# Patient Record
Sex: Female | Born: 1945 | Race: White | Hispanic: No | Marital: Married | Smoking: Never smoker
Health system: Southern US, Community
[De-identification: ages and names within clinical notes are randomized; demographics above are authoritative.]

## PROBLEM LIST (undated history)

## (undated) DIAGNOSIS — Z789 Other specified health status: Secondary | ICD-10-CM

## (undated) HISTORY — PX: OTHER SURGICAL HISTORY: SHX169

## (undated) HISTORY — PX: JOINT REPLACEMENT: SHX530

---

## 1998-06-30 ENCOUNTER — Other Ambulatory Visit: Admission: RE | Admit: 1998-06-30 | Discharge: 1998-06-30 | Payer: Self-pay | Admitting: Obstetrics and Gynecology

## 1998-08-24 ENCOUNTER — Ambulatory Visit (HOSPITAL_COMMUNITY): Admission: RE | Admit: 1998-08-24 | Discharge: 1998-08-24 | Payer: Self-pay | Admitting: Gastroenterology

## 1999-07-12 ENCOUNTER — Other Ambulatory Visit: Admission: RE | Admit: 1999-07-12 | Discharge: 1999-07-12 | Payer: Self-pay | Admitting: Obstetrics and Gynecology

## 2000-06-06 ENCOUNTER — Other Ambulatory Visit: Admission: RE | Admit: 2000-06-06 | Discharge: 2000-06-06 | Payer: Self-pay | Admitting: Internal Medicine

## 2000-06-11 ENCOUNTER — Encounter: Payer: Self-pay | Admitting: Internal Medicine

## 2000-06-11 ENCOUNTER — Encounter: Admission: RE | Admit: 2000-06-11 | Discharge: 2000-06-11 | Payer: Self-pay | Admitting: Internal Medicine

## 2001-05-14 ENCOUNTER — Other Ambulatory Visit: Admission: RE | Admit: 2001-05-14 | Discharge: 2001-05-14 | Payer: Self-pay | Admitting: Obstetrics and Gynecology

## 2001-06-13 ENCOUNTER — Encounter: Admission: RE | Admit: 2001-06-13 | Discharge: 2001-06-13 | Payer: Self-pay | Admitting: Internal Medicine

## 2001-06-13 ENCOUNTER — Encounter: Payer: Self-pay | Admitting: Internal Medicine

## 2002-05-27 ENCOUNTER — Other Ambulatory Visit: Admission: RE | Admit: 2002-05-27 | Discharge: 2002-05-27 | Payer: Self-pay | Admitting: Obstetrics and Gynecology

## 2002-06-16 ENCOUNTER — Encounter: Payer: Self-pay | Admitting: Internal Medicine

## 2002-06-16 ENCOUNTER — Encounter: Admission: RE | Admit: 2002-06-16 | Discharge: 2002-06-16 | Payer: Self-pay | Admitting: Internal Medicine

## 2002-07-24 ENCOUNTER — Encounter: Admission: RE | Admit: 2002-07-24 | Discharge: 2002-07-24 | Payer: Self-pay | Admitting: Internal Medicine

## 2002-07-24 ENCOUNTER — Encounter: Payer: Self-pay | Admitting: Internal Medicine

## 2003-06-16 ENCOUNTER — Other Ambulatory Visit: Admission: RE | Admit: 2003-06-16 | Discharge: 2003-06-16 | Payer: Self-pay | Admitting: Obstetrics and Gynecology

## 2003-06-23 ENCOUNTER — Encounter: Admission: RE | Admit: 2003-06-23 | Discharge: 2003-06-23 | Payer: Self-pay | Admitting: Internal Medicine

## 2003-06-23 ENCOUNTER — Encounter: Payer: Self-pay | Admitting: Internal Medicine

## 2004-06-27 ENCOUNTER — Encounter: Admission: RE | Admit: 2004-06-27 | Discharge: 2004-06-27 | Payer: Self-pay | Admitting: Internal Medicine

## 2004-06-30 ENCOUNTER — Encounter: Admission: RE | Admit: 2004-06-30 | Discharge: 2004-06-30 | Payer: Self-pay | Admitting: Internal Medicine

## 2005-07-12 ENCOUNTER — Encounter: Admission: RE | Admit: 2005-07-12 | Discharge: 2005-07-12 | Payer: Self-pay | Admitting: Internal Medicine

## 2005-07-20 ENCOUNTER — Other Ambulatory Visit: Admission: RE | Admit: 2005-07-20 | Discharge: 2005-07-20 | Payer: Self-pay | Admitting: Internal Medicine

## 2005-07-24 ENCOUNTER — Encounter: Admission: RE | Admit: 2005-07-24 | Discharge: 2005-07-24 | Payer: Self-pay | Admitting: Internal Medicine

## 2005-07-31 ENCOUNTER — Encounter: Admission: RE | Admit: 2005-07-31 | Discharge: 2005-07-31 | Payer: Self-pay | Admitting: Internal Medicine

## 2006-07-13 ENCOUNTER — Encounter: Admission: RE | Admit: 2006-07-13 | Discharge: 2006-07-13 | Payer: Self-pay | Admitting: Internal Medicine

## 2006-07-23 ENCOUNTER — Other Ambulatory Visit: Admission: RE | Admit: 2006-07-23 | Discharge: 2006-07-23 | Payer: Self-pay | Admitting: Internal Medicine

## 2007-07-16 ENCOUNTER — Encounter: Admission: RE | Admit: 2007-07-16 | Discharge: 2007-07-16 | Payer: Self-pay | Admitting: Gastroenterology

## 2008-07-16 ENCOUNTER — Encounter: Admission: RE | Admit: 2008-07-16 | Discharge: 2008-07-16 | Payer: Self-pay | Admitting: Internal Medicine

## 2008-07-23 ENCOUNTER — Encounter: Admission: RE | Admit: 2008-07-23 | Discharge: 2008-07-23 | Payer: Self-pay | Admitting: Internal Medicine

## 2009-07-26 ENCOUNTER — Encounter: Admission: RE | Admit: 2009-07-26 | Discharge: 2009-07-26 | Payer: Self-pay | Admitting: Internal Medicine

## 2010-07-27 ENCOUNTER — Encounter: Admission: RE | Admit: 2010-07-27 | Discharge: 2010-07-27 | Payer: Self-pay | Admitting: Internal Medicine

## 2010-12-04 ENCOUNTER — Encounter: Payer: Self-pay | Admitting: Internal Medicine

## 2011-03-27 ENCOUNTER — Encounter (HOSPITAL_COMMUNITY)
Admission: RE | Admit: 2011-03-27 | Discharge: 2011-03-27 | Disposition: A | Discharge status: Home / Self Care | Payer: Medicare Other | Source: Ambulatory Visit | Attending: Orthopedic Surgery | Admitting: Orthopedic Surgery

## 2011-03-27 ENCOUNTER — Other Ambulatory Visit (HOSPITAL_COMMUNITY): Payer: Self-pay | Admitting: Orthopedic Surgery

## 2011-03-27 ENCOUNTER — Ambulatory Visit (HOSPITAL_COMMUNITY)
Admission: RE | Admit: 2011-03-27 | Discharge: 2011-03-27 | Disposition: A | Discharge status: Home / Self Care | Payer: Medicare Other | Source: Ambulatory Visit | Attending: Orthopedic Surgery | Admitting: Orthopedic Surgery

## 2011-03-27 DIAGNOSIS — Z01818 Encounter for other preprocedural examination: Secondary | ICD-10-CM | POA: Insufficient documentation

## 2011-03-27 DIAGNOSIS — M1711 Unilateral primary osteoarthritis, right knee: Secondary | ICD-10-CM

## 2011-03-27 DIAGNOSIS — I252 Old myocardial infarction: Secondary | ICD-10-CM | POA: Insufficient documentation

## 2011-03-27 DIAGNOSIS — Z01810 Encounter for preprocedural cardiovascular examination: Secondary | ICD-10-CM | POA: Insufficient documentation

## 2011-03-27 DIAGNOSIS — Z01812 Encounter for preprocedural laboratory examination: Secondary | ICD-10-CM | POA: Insufficient documentation

## 2011-03-27 LAB — CBC
HCT: 38.9 % (ref 36.0–46.0)
RDW: 13.1 % (ref 11.5–15.5)

## 2011-03-27 LAB — URINE MICROSCOPIC-ADD ON

## 2011-03-27 LAB — DIFFERENTIAL
Eosinophils Absolute: 0 10*3/uL (ref 0.0–0.7)
Monocytes Absolute: 0.4 10*3/uL (ref 0.1–1.0)
Neutro Abs: 4.1 10*3/uL (ref 1.7–7.7)
Neutrophils Relative %: 65 % (ref 43–77)

## 2011-03-27 LAB — BASIC METABOLIC PANEL
CO2: 28 mEq/L (ref 19–32)
Creatinine, Ser: 0.7 mg/dL (ref 0.4–1.2)
GFR calc Af Amer: >60 mL/min (ref >60)
Sodium: 139 mEq/L (ref 135–145)

## 2011-03-27 LAB — SURGICAL PCR SCREEN: MRSA, PCR: NEGATIVE

## 2011-03-27 LAB — URINALYSIS, ROUTINE W REFLEX MICROSCOPIC
Bilirubin Urine: NEGATIVE
Glucose, UA: NEGATIVE mg/dL
Hgb urine dipstick: NEGATIVE
Ketones, ur: NEGATIVE mg/dL
Nitrite: NEGATIVE
Protein, ur: NEGATIVE mg/dL
Urobilinogen, UA: 0.2 mg/dL (ref 0.0–1.0)
pH: 5.5 (ref 5.0–8.0)

## 2011-03-27 LAB — PROTIME-INR
INR: 0.87 (ref 0.00–1.49)
Prothrombin Time: 12 seconds (ref 11.6–15.2)

## 2011-04-03 ENCOUNTER — Inpatient Hospital Stay (HOSPITAL_COMMUNITY)
Admission: RE | Admit: 2011-04-03 | Discharge: 2011-04-05 | DRG: 470 | Disposition: A | Discharge status: Home Health | Payer: Medicare Other | Source: Ambulatory Visit | Attending: Orthopedic Surgery | Admitting: Orthopedic Surgery

## 2011-04-03 DIAGNOSIS — M1710 Unilateral primary osteoarthritis, unspecified knee: Principal | ICD-10-CM | POA: Diagnosis present

## 2011-04-04 LAB — BASIC METABOLIC PANEL
BUN: 9 mg/dL (ref 6–23)
Calcium: 9.1 mg/dL (ref 8.4–10.5)
Creatinine, Ser: 0.61 mg/dL (ref 0.4–1.2)
GFR calc non Af Amer: >60 mL/min (ref >60)
Glucose, Bld: 146 mg/dL — ABNORMAL HIGH (ref 70–99)
Potassium: 4.6 mEq/L (ref 3.5–5.1)

## 2011-04-04 LAB — PROTIME-INR: INR: 0.99 (ref 0.00–1.49)

## 2011-04-04 LAB — CBC
MCV: 98.7 fL (ref 78.0–100.0)
RDW: 13.1 % (ref 11.5–15.5)

## 2011-04-05 LAB — PROTIME-INR
INR: 1.26 (ref 0.00–1.49)
Prothrombin Time: 16 seconds — ABNORMAL HIGH (ref 11.6–15.2)

## 2011-04-05 LAB — CBC
HCT: 27.8 % — ABNORMAL LOW (ref 36.0–46.0)
MCHC: 32.7 g/dL (ref 30.0–36.0)
Platelets: 226 10*3/uL (ref 150–400)
RDW: 13 % (ref 11.5–15.5)
WBC: 7.4 10*3/uL (ref 4.0–10.5)

## 2011-04-05 NOTE — Op Note (Signed)
Megan Hebert, Megan Hebert           ACCOUNT NO.:  [id number]  MEDICAL RECORD NO.:  [id number]           PATIENT TYPE:  I  LOCATION:  5029                         FACILITY:  [facility]  PHYSICIAN:  Feliberto Gottron. Turner Daniels, M.D.   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  PROCEDURE:  Right total knee arthroplasty using DePuy Sigma RP components 2.5 femur right, 2.5 tibia, 10-mm Sigma RP bearing, 32-mm patellar button.  All components cemented with double batch of DePuy HV cement with 1200 mg of tobramycin.  SURGEON:  Feliberto Gottron. Turner Daniels, MD  FIRST ASSISTANT:  Shirl Harris, PA-C  ANESTHESIA:  Right femoral nerve block plus general LMA.  ESTIMATED BLOOD LOSS:  Minimal.  FLUID REPLACEMENT:  1500 mL crystalloid.  DRAINS PLACED:  Foley catheter and two medium Hemovacs.  URINE OUTPUT:  300 mL.  TOURNIQUET TIME:  1 hour and 20 minutes.  INDICATIONS FOR PROCEDURE:  This is a 65 year old woman with end-stage arthritis, right greater than left knee; significant varus deformity of the right knee about 10 degrees with erosion of the medial tibial plateau from the end-stage bone-on-bone arthritis.  She has failed conservative measures with anti-inflammatory medicines, physical therapy, attempts at weight loss, cortisone injections, and Viscoat supplementation.  She desires elective right total knee arthroplasty to decrease pain and increase function.  Risks and benefits of surgery discussed.  All questions answered.  DESCRIPTION OF PROCEDURE:  The patient identified by armband and received preoperative IV vancomycin in the holding area at [facility] and a right femoral nerve block.  Taken to operating room  for appropriate anesthetic monitors attached.  An LMA anesthesia induced with the patient in supine position.  Foley catheter inserted. Tourniquet applied high to the right thigh.  Foot positioner and lateral post applied to the table right.  Lower extremity prepped and draped in the usual sterile fashion from the ankle to the tourniquet.  Time-out procedure performed.  Limb wrapped in Esmarch bandage, tourniquet inflated to 350 mmHg with the knee bent.  We began the operation by making an anterior midline incision about 14 cm in length through the skin and subcutaneous tissue starting a handbreadth above the patella, going over the patella 1 cm medial to and 3 cm distal to the tibial tubercle.  We cut down to the transverse retinaculum, it was incised and reflected medially allowing a medial parapatellar arthrotomy. Prepatellar fat pad was resected.  Superficial medial collateral ligament then elevated from anterior-to-posterior off the medial flare of the tibia leaving intact distally accomplishing a partial release of medial side where she was tight.  The patella was then everted, the knee was hyperflexed, and the menisci were resected as were the cruciate ligaments.  We placed a lateral Hohmann retractor, a Mchale retractor through the notch and a posteromedial Z retractor.  We then entered the proximal tibia, coaxial with the tibial medullary canal with the tibial  cutting guide which was then pinned in place, 2 degrees posterior slope allowing resection of 2-3 mm of bone medially and 7-8 mm of bone laterally.  After the cutting guide and pinned in place and posterior structures protected, we then accomplished the proximal tibial cut.  We then entered the distal femur 2 mm anterior to the PCL origin with a step drill followed by the IM rod and a 5 degrees right distal femoral cutting guide set at 11 mm, pinned along the epicondylar axis, and distal femoral cut was accomplished.  We  measured for a 2.5 femoral component using the posterior referencing sizing guide.  The sizing guide was pinned into 3 degrees of external rotation.  A 2.5 chamfer cutting guide was then screwed into place.  The anterior, posterior, and chamfer cuts accomplished without difficulty followed by the Sigma RP box cut.  The knee was brought to extension allowing [place] to check our extension gap with a 10-mm lollipop and also resected any remnants of the menisci.  The patella measured 20 mm, cutting guide set at 13 for a presumed 32-mm button which was then measured and drilled.  The knee was once again hyperflexed exposing the proximal tibia which was measured for 2.5 tibial baseplate.  This was pinned into place followed by the smokestack and a conical reamer and the Delta fin keel punch.  At this point, the trial components were inserted.  A 2.5 right femoral trial was hammered into place followed by the lug drill and the 10-mm trial polyethylene bearing.  The knee came to full extension, flexed to 135 degrees, and ligaments were noted to be stable.  The 32 patellar trial tracked with no thumb pressure.  Trial components were removed.  All bony surfaces were water-picked, cleaned, dried, with suction sponges. Double batch of DePuy HV cement with 1200 mg tobramycin was then mixed and applied to all bony metallic mating surfaces except for the posterior condyles of femur itself.  In order, we hammered into place a 2.5 tibial baseplate and removed excess cement.  A 2.5 right femoral component and removed excess cement.  We inserted the 10-mm Sigma RP bearing and reduced the knee.  The 32-mm patellar button was then squeezed into place with a clamp and excess cement removed.  The wound irrigated out with normal saline solution and pulse lavage.  Medium Hemovac drains were  placed from the anterolateral approach.  After the cement cured, we checked our tracking one more time and then closed with a  running #1 Vicryl suture in the parapatellar arthrotomy, 0 and 2-0 undyed Vicryl suture in the subcutaneous tissue,  Skin staples, Xeroform, 4 x 4s, Webril, and an Ace wrap.  The patient was then awaked and extubated, short knee immobilizer placed, and she was taken to the recovery room without difficulty.     Feliberto Gottron. Turner Daniels, M.D.     Ovid Curd  D:  04/03/2011  T:  04/03/2011  Job:  [id number]  Electronically Signed by Gean Birchwood M.D. on 04/05/2011 08:36:19 AM

## 2011-06-22 ENCOUNTER — Other Ambulatory Visit: Payer: Self-pay | Admitting: Internal Medicine

## 2011-06-22 DIAGNOSIS — Z1231 Encounter for screening mammogram for malignant neoplasm of breast: Secondary | ICD-10-CM

## 2011-07-31 ENCOUNTER — Ambulatory Visit
Admission: RE | Admit: 2011-07-31 | Discharge: 2011-07-31 | Disposition: A | Discharge status: Home / Self Care | Payer: BC Managed Care – PPO | Source: Ambulatory Visit | Attending: Internal Medicine | Admitting: Internal Medicine

## 2011-07-31 DIAGNOSIS — Z1231 Encounter for screening mammogram for malignant neoplasm of breast: Secondary | ICD-10-CM

## 2012-04-30 ENCOUNTER — Other Ambulatory Visit: Payer: Self-pay | Admitting: Orthopedic Surgery

## 2012-05-01 ENCOUNTER — Encounter (HOSPITAL_COMMUNITY): Payer: Self-pay | Admitting: Pharmacy Technician

## 2012-05-02 NOTE — Pre-Procedure Instructions (Signed)
20 CHARIKA MIKELSON  05/02/2012   Your procedure is scheduled on:  June 26th  Report to [facility] Short Stay Center at  Call this number if you have problems the morning of surgery: [phone]   Remember:   Do not eat food or drink:After Midnight.  Take these medicines the morning of surgery with A SIP OF WATER: tylenol if needed   Do not wear jewelry, make-up or nail polish.  Do not wear lotions, powders, or perfumes.   Do not shave 48 hours prior to surgery. Men may shave face and neck.  Do not bring valuables to the hospital.  Contacts, dentures or bridgework may not be worn into surgery.  Leave suitcase in the car. After surgery it may be brought to your room.  For patients admitted to the hospital, checkout time is 11:00 AM the day of discharge.   Patients discharged the day of surgery will not be allowed to drive home.  Special Instructions: CHG Shower Use Special Wash: 1/2 bottle night before surgery and 1/2 bottle morning of surgery.   Please read over the following fact sheets that you were given: Pain Booklet, Coughing and Deep Breathing, Blood Transfusion Information, MRSA Information and Surgical Site Infection Prevention

## 2012-05-03 ENCOUNTER — Encounter (HOSPITAL_COMMUNITY)
Admission: RE | Admit: 2012-05-03 | Discharge: 2012-05-03 | Disposition: A | Discharge status: Home / Self Care | Payer: Medicare Other | Source: Ambulatory Visit | Attending: Orthopedic Surgery | Admitting: Orthopedic Surgery

## 2012-05-03 ENCOUNTER — Encounter (HOSPITAL_COMMUNITY): Payer: Self-pay

## 2012-05-03 HISTORY — DX: Other specified health status: Z78.9

## 2012-05-03 LAB — CBC
HCT: 41.2 % (ref 36.0–46.0)
Hemoglobin: 13.4 g/dL (ref 12.0–15.0)
MCHC: 32.5 g/dL (ref 30.0–36.0)
RBC: 4.24 MIL/uL (ref 3.87–5.11)
WBC: 5.3 10*3/uL (ref 4.0–10.5)

## 2012-05-03 LAB — PROTIME-INR: INR: 0.94 (ref 0.00–1.49)

## 2012-05-03 LAB — URINALYSIS, ROUTINE W REFLEX MICROSCOPIC
Glucose, UA: NEGATIVE mg/dL
Ketones, ur: NEGATIVE mg/dL
Leukocytes, UA: NEGATIVE
Protein, ur: NEGATIVE mg/dL
Urobilinogen, UA: 0.2 mg/dL (ref 0.0–1.0)

## 2012-05-03 LAB — DIFFERENTIAL
Basophils Relative: 1 % (ref 0–1)
Lymphocytes Relative: 35 % (ref 12–46)
Lymphs Abs: 1.8 10*3/uL (ref 0.7–4.0)
Monocytes Absolute: 0.3 10*3/uL (ref 0.1–1.0)
Monocytes Relative: 6 % (ref 3–12)
Neutro Abs: 3 10*3/uL (ref 1.7–7.7)
Neutrophils Relative %: 58 % (ref 43–77)

## 2012-05-03 LAB — APTT: aPTT: 29 seconds (ref 24–37)

## 2012-05-03 LAB — BASIC METABOLIC PANEL
Chloride: 103 mEq/L (ref 96–112)
GFR calc Af Amer: >90 mL/min (ref >90)
GFR calc non Af Amer: 86 mL/min — ABNORMAL LOW (ref >90)
Potassium: 4.7 mEq/L (ref 3.5–5.1)
Sodium: 140 mEq/L (ref 135–145)

## 2012-05-03 LAB — TYPE AND SCREEN
ABO/RH(D): A NEG
Antibody Screen: NEGATIVE

## 2012-05-07 MED ORDER — CEFAZOLIN SODIUM-DEXTROSE 2-3 GM-% IV SOLR
2.0000 g | INTRAVENOUS | Status: AC
  Administered 2012-05-08: 2 g via INTRAVENOUS
  Filled 2012-05-07: qty 50

## 2012-05-07 NOTE — H&P (Signed)
Subjective: Ms. Garretson returns in followup for her left knee which has end-stage arthritis.  She had all 5 Supartz injections and was initially reporting good pain relief.  Unfortunately, all of her knee pain has returned and she is considering knee replacement.  She had the contralateral knee replaced in May of 2012.  She hopes to have the left knee done this summer prior to the beginning of the new school year.   PAST MEDICAL HISTORY:  She is allergic to cefoxitin and Flagyl.  Significant for use of contact lenses.  No blood thinners.  She had osteomyelitis many years ago as a child.  She is s/p right TKA in 2012.  Her family history is positive for heart disease.    Social Hx: She occasionally has a drink of alcohol.  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    REVIEW OF SYSTEMS:  No prior problems with her right knee.  She has had back pain in the past.  Patient denies dizziness, nausea, fever, chills, vomiting, shortness of breath, chest pain, loss of appetite, or rash.    PHYSICAL EXAM: Well-developed, well-nourished.  Awake, alert, and oriented x3.  Extraocular motion is intact.  No use of accessory respiratory muscles for breathing.   Cardiovascular exam reveals a regular rhythm.  Skin is intact without cuts, scrapes, or abrasions.   Examination of the left knee demonstrates tenderness to palpation along the medial joint line.  ROM 0-120, trace effusion.  Skin is intact.  Neurovascular exam is within normal limits.  Asses: End-stage arthritis of the left knee  Plan: We have discussed knee replacement surgery with Ms. Kozlov today.  She is well aware of the risks and benefits.  She will talk to [place] today about scheduling.

## 2012-05-08 ENCOUNTER — Ambulatory Visit (HOSPITAL_COMMUNITY): Payer: Medicare Other | Admitting: Certified Registered"

## 2012-05-08 ENCOUNTER — Encounter (HOSPITAL_COMMUNITY)
Admission: RE | Disposition: A | Discharge status: Home Health | Payer: Self-pay | Source: Ambulatory Visit | Attending: Orthopedic Surgery

## 2012-05-08 ENCOUNTER — Encounter (HOSPITAL_COMMUNITY): Payer: Self-pay | Admitting: Certified Registered"

## 2012-05-08 ENCOUNTER — Inpatient Hospital Stay (HOSPITAL_COMMUNITY)
Admission: RE | Admit: 2012-05-08 | Discharge: 2012-05-10 | DRG: 470 | Disposition: A | Discharge status: Home Health | Payer: Medicare Other | Source: Ambulatory Visit | Attending: Orthopedic Surgery | Admitting: Orthopedic Surgery

## 2012-05-08 DIAGNOSIS — Z79899 Other long term (current) drug therapy: Secondary | ICD-10-CM

## 2012-05-08 DIAGNOSIS — Z8249 Family history of ischemic heart disease and other diseases of the circulatory system: Secondary | ICD-10-CM

## 2012-05-08 DIAGNOSIS — R110 Nausea: Secondary | ICD-10-CM | POA: Diagnosis not present

## 2012-05-08 DIAGNOSIS — Z96659 Presence of unspecified artificial knee joint: Secondary | ICD-10-CM

## 2012-05-08 DIAGNOSIS — M1710 Unilateral primary osteoarthritis, unspecified knee: Principal | ICD-10-CM | POA: Diagnosis present

## 2012-05-08 DIAGNOSIS — Z01812 Encounter for preprocedural laboratory examination: Secondary | ICD-10-CM

## 2012-05-08 DIAGNOSIS — Z888 Allergy status to other drugs, medicaments and biological substances status: Secondary | ICD-10-CM

## 2012-05-08 DIAGNOSIS — Z7982 Long term (current) use of aspirin: Secondary | ICD-10-CM

## 2012-05-08 DIAGNOSIS — M1712 Unilateral primary osteoarthritis, left knee: Secondary | ICD-10-CM | POA: Diagnosis present

## 2012-05-08 HISTORY — PX: TOTAL KNEE ARTHROPLASTY: SHX125

## 2012-05-08 SURGERY — ARTHROPLASTY, KNEE, TOTAL
Anesthesia: General | Site: Knee | Laterality: Left | Wound class: Clean

## 2012-05-08 MED ORDER — LACTATED RINGERS IV SOLN
INTRAVENOUS | Status: DC | PRN
  Administered 2012-05-08 (×2): via INTRAVENOUS

## 2012-05-08 MED ORDER — METOCLOPRAMIDE HCL 10 MG PO TABS: 5.0000 mg | ORAL_TABLET | Freq: Three times a day (TID) | ORAL | Status: DC | PRN

## 2012-05-08 MED ORDER — TOBRAMYCIN SULFATE 1.2 G IJ SOLR
INTRAMUSCULAR | Status: DC | PRN
  Administered 2012-05-08: 1.2 g

## 2012-05-08 MED ORDER — LACTATED RINGERS IV SOLN
INTRAVENOUS | Status: DC
  Administered 2012-05-08: 14:00:00 via INTRAVENOUS

## 2012-05-08 MED ORDER — DIPHENHYDRAMINE HCL 12.5 MG/5ML PO ELIX: 12.5000 mg | ORAL_SOLUTION | ORAL | Status: DC | PRN

## 2012-05-08 MED ORDER — HYDROMORPHONE HCL PF 1 MG/ML IJ SOLN
0.2500 mg | INTRAMUSCULAR | Status: DC | PRN
  Administered 2012-05-08 (×4): 0.5 mg via INTRAVENOUS

## 2012-05-08 MED ORDER — METHOCARBAMOL 500 MG PO TABS
500.0000 mg | ORAL_TABLET | Freq: Four times a day (QID) | ORAL | Status: DC | PRN
  Administered 2012-05-09 – 2012-05-10 (×2): 500 mg via ORAL
  Filled 2012-05-08 (×2): qty 1

## 2012-05-08 MED ORDER — ASPIRIN EC 325 MG PO TBEC
325.0000 mg | DELAYED_RELEASE_TABLET | Freq: Two times a day (BID) | ORAL | Status: DC
  Administered 2012-05-08 – 2012-05-10 (×4): 325 mg via ORAL
  Filled 2012-05-08 (×6): qty 1

## 2012-05-08 MED ORDER — METOCLOPRAMIDE HCL 5 MG/ML IJ SOLN
5.0000 mg | Freq: Three times a day (TID) | INTRAMUSCULAR | Status: DC | PRN
  Filled 2012-05-08: qty 2

## 2012-05-08 MED ORDER — ACETAMINOPHEN 325 MG PO TABS: 650.0000 mg | ORAL_TABLET | Freq: Four times a day (QID) | ORAL | Status: DC | PRN

## 2012-05-08 MED ORDER — CHLORHEXIDINE GLUCONATE 4 % EX LIQD: 60.0000 mL | Freq: Once | CUTANEOUS | Status: DC

## 2012-05-08 MED ORDER — HYDROMORPHONE HCL PF 1 MG/ML IJ SOLN
0.5000 mg | INTRAMUSCULAR | Status: DC | PRN
  Administered 2012-05-08: 1 mg via INTRAVENOUS
  Filled 2012-05-08: qty 1

## 2012-05-08 MED ORDER — ALUM & MAG HYDROXIDE-SIMETH 200-200-20 MG/5ML PO SUSP: 30.0000 mL | ORAL | Status: DC | PRN

## 2012-05-08 MED ORDER — MIDAZOLAM HCL 2 MG/2ML IJ SOLN
1.0000 mg | INTRAMUSCULAR | Status: DC | PRN
  Administered 2012-05-08: 1 mg via INTRAVENOUS

## 2012-05-08 MED ORDER — FENTANYL CITRATE 0.05 MG/ML IJ SOLN
INTRAMUSCULAR | Status: DC | PRN
  Administered 2012-05-08: 100 ug via INTRAVENOUS
  Administered 2012-05-08 (×2): 50 ug via INTRAVENOUS

## 2012-05-08 MED ORDER — FENTANYL CITRATE 0.05 MG/ML IJ SOLN
INTRAMUSCULAR | Status: AC
  Filled 2012-05-08: qty 2

## 2012-05-08 MED ORDER — PHENOL 1.4 % MT LIQD: 1.0000 | OROMUCOSAL | Status: DC | PRN

## 2012-05-08 MED ORDER — HYDROMORPHONE HCL PF 1 MG/ML IJ SOLN
INTRAMUSCULAR | Status: AC
  Filled 2012-05-08: qty 1

## 2012-05-08 MED ORDER — KCL IN DEXTROSE-NACL 20-5-0.45 MEQ/L-%-% IV SOLN
INTRAVENOUS | Status: DC
  Administered 2012-05-08: 20:00:00 via INTRAVENOUS
  Filled 2012-05-08 (×8): qty 1000

## 2012-05-08 MED ORDER — OXYCODONE-ACETAMINOPHEN 5-325 MG PO TABS
1.0000 | ORAL_TABLET | ORAL | Status: DC | PRN
  Administered 2012-05-09 – 2012-05-10 (×5): 2 via ORAL
  Filled 2012-05-08 (×5): qty 2

## 2012-05-08 MED ORDER — MENTHOL 3 MG MT LOZG: 1.0000 | LOZENGE | OROMUCOSAL | Status: DC | PRN

## 2012-05-08 MED ORDER — METHOCARBAMOL 100 MG/ML IJ SOLN
500.0000 mg | Freq: Four times a day (QID) | INTRAMUSCULAR | Status: DC | PRN
  Administered 2012-05-08: 500 mg via INTRAVENOUS
  Filled 2012-05-08 (×2): qty 5

## 2012-05-08 MED ORDER — ONDANSETRON HCL 4 MG PO TABS
4.0000 mg | ORAL_TABLET | Freq: Four times a day (QID) | ORAL | Status: DC | PRN
  Administered 2012-05-09 – 2012-05-10 (×3): 4 mg via ORAL
  Filled 2012-05-08 (×3): qty 1

## 2012-05-08 MED ORDER — ONDANSETRON HCL 4 MG/2ML IJ SOLN: 4.0000 mg | Freq: Four times a day (QID) | INTRAMUSCULAR | Status: DC | PRN

## 2012-05-08 MED ORDER — TOBRAMYCIN SULFATE 1.2 G IJ SOLR
INTRAMUSCULAR | Status: AC
  Filled 2012-05-08: qty 1.2

## 2012-05-08 MED ORDER — FLEET ENEMA 7-19 GM/118ML RE ENEM: 1.0000 | ENEMA | Freq: Once | RECTAL | Status: AC | PRN

## 2012-05-08 MED ORDER — ONDANSETRON HCL 4 MG/2ML IJ SOLN
4.0000 mg | Freq: Four times a day (QID) | INTRAMUSCULAR | Status: DC | PRN
  Administered 2012-05-08 – 2012-05-09 (×2): 4 mg via INTRAVENOUS
  Filled 2012-05-08 (×2): qty 2

## 2012-05-08 MED ORDER — PROPOFOL 10 MG/ML IV EMUL
INTRAVENOUS | Status: DC | PRN
  Administered 2012-05-08: 150 mg via INTRAVENOUS

## 2012-05-08 MED ORDER — BISACODYL 5 MG PO TBEC: 5.0000 mg | DELAYED_RELEASE_TABLET | Freq: Every day | ORAL | Status: DC | PRN

## 2012-05-08 MED ORDER — MIDAZOLAM HCL 2 MG/2ML IJ SOLN
INTRAMUSCULAR | Status: AC
  Filled 2012-05-08: qty 2

## 2012-05-08 MED ORDER — ACETAMINOPHEN 650 MG RE SUPP: 650.0000 mg | Freq: Four times a day (QID) | RECTAL | Status: DC | PRN

## 2012-05-08 MED ORDER — OXYCODONE HCL 5 MG PO TABS: 5.0000 mg | ORAL_TABLET | ORAL | Status: DC | PRN

## 2012-05-08 MED ORDER — DEXTROSE-NACL 5-0.45 % IV SOLN: INTRAVENOUS | Status: DC

## 2012-05-08 MED ORDER — ACETAMINOPHEN 10 MG/ML IV SOLN
INTRAVENOUS | Status: DC | PRN
  Administered 2012-05-08: 1000 mg via INTRAVENOUS

## 2012-05-08 MED ORDER — COQ10 100 MG PO CAPS: 1.0000 | ORAL_CAPSULE | Freq: Every day | ORAL | Status: DC

## 2012-05-08 MED ORDER — FENTANYL CITRATE 0.05 MG/ML IJ SOLN
50.0000 ug | INTRAMUSCULAR | Status: DC | PRN
  Administered 2012-05-08: 50 ug via INTRAVENOUS

## 2012-05-08 MED ORDER — BUPIVACAINE-EPINEPHRINE PF 0.5-1:200000 % IJ SOLN
INTRAMUSCULAR | Status: DC | PRN
  Administered 2012-05-08: 30 mL

## 2012-05-08 MED ORDER — MAGNESIUM HYDROXIDE 400 MG/5ML PO SUSP: 30.0000 mL | Freq: Every day | ORAL | Status: DC | PRN

## 2012-05-08 SURGICAL SUPPLY — 64 items
BANDAGE ELASTIC 6 VELCRO ST LF (GAUZE/BANDAGES/DRESSINGS) ×1 IMPLANT
BANDAGE ESMARK 6X9 LF (GAUZE/BANDAGES/DRESSINGS) ×1 IMPLANT
BLADE SAG 18X100X1.27 (BLADE) ×2 IMPLANT
BLADE SAW SGTL 13X75X1.27 (BLADE) ×2 IMPLANT
BLADE SURG ROTATE 9660 (MISCELLANEOUS) IMPLANT
BNDG CMPR 9X6 STRL LF SNTH (GAUZE/BANDAGES/DRESSINGS) ×1
BNDG CMPR MED 10X6 ELC LF (GAUZE/BANDAGES/DRESSINGS) ×1
BNDG ELASTIC 6X10 VLCR STRL LF (GAUZE/BANDAGES/DRESSINGS) ×2 IMPLANT
BNDG ESMARK 6X9 LF (GAUZE/BANDAGES/DRESSINGS) ×2
BOWL SMART MIX CTS (DISPOSABLE) ×2 IMPLANT
CEMENT HV SMART SET (Cement) ×3 IMPLANT
CLOTH BEACON ORANGE TIMEOUT ST (SAFETY) ×2 IMPLANT
COVER BACK TABLE 24X17X13 BIG (DRAPES) IMPLANT
COVER SURGICAL LIGHT HANDLE (MISCELLANEOUS) ×2 IMPLANT
CUFF TOURNIQUET SINGLE 34IN LL (TOURNIQUET CUFF) ×2 IMPLANT
CUFF TOURNIQUET SINGLE 44IN (TOURNIQUET CUFF) IMPLANT
DRAPE EXTREMITY T 121X128X90 (DRAPE) ×2 IMPLANT
DRAPE U-SHAPE 47X51 STRL (DRAPES) ×2 IMPLANT
DURAPREP 26ML APPLICATOR (WOUND CARE) ×2 IMPLANT
ELECT REM PT RETURN 9FT ADLT (ELECTROSURGICAL) ×2
ELECTRODE REM PT RTRN 9FT ADLT (ELECTROSURGICAL) ×1 IMPLANT
EVACUATOR 1/8 PVC DRAIN (DRAIN) ×2 IMPLANT
GAUZE XEROFORM 1X8 LF (GAUZE/BANDAGES/DRESSINGS) ×2 IMPLANT
GLOVE BIO SURGEON STRL SZ 6.5 (GLOVE) ×1 IMPLANT
GLOVE BIO SURGEON STRL SZ7 (GLOVE) ×2 IMPLANT
GLOVE BIO SURGEON STRL SZ7.5 (GLOVE) ×2 IMPLANT
GLOVE BIO SURGEON STRL SZ8.5 (GLOVE) ×1 IMPLANT
GLOVE BIOGEL PI IND STRL 6.5 (GLOVE) ×1 IMPLANT
GLOVE BIOGEL PI IND STRL 7.0 (GLOVE) ×2 IMPLANT
GLOVE BIOGEL PI IND STRL 8 (GLOVE) ×1 IMPLANT
GLOVE BIOGEL PI INDICATOR 6.5 (GLOVE) ×1
GLOVE BIOGEL PI INDICATOR 7.0 (GLOVE) ×2
GLOVE BIOGEL PI INDICATOR 8 (GLOVE) ×1
GLOVE SURG SS PI 7.0 STRL IVOR (GLOVE) ×3 IMPLANT
GLOVE SURG SS PI 8.5 STRL IVOR (GLOVE) ×1
GLOVE SURG SS PI 8.5 STRL STRW (GLOVE) IMPLANT
GOWN PREVENTION PLUS XLARGE (GOWN DISPOSABLE) ×2 IMPLANT
GOWN SRG XL XLNG 56XLVL 4 (GOWN DISPOSABLE) ×1 IMPLANT
GOWN STRL NON-REIN LRG LVL3 (GOWN DISPOSABLE) ×5 IMPLANT
GOWN STRL NON-REIN XL XLG LVL4 (GOWN DISPOSABLE) ×2
GOWN STRL REIN 3XL XLG LVL4 (GOWN DISPOSABLE) ×2 IMPLANT
HANDPIECE INTERPULSE COAX TIP (DISPOSABLE) ×2
HOOD PEEL AWAY FACE SHEILD DIS (HOOD) ×6 IMPLANT
KIT BASIN OR (CUSTOM PROCEDURE TRAY) ×2 IMPLANT
KIT ROOM TURNOVER OR (KITS) ×2 IMPLANT
MANIFOLD NEPTUNE II (INSTRUMENTS) ×2 IMPLANT
NS IRRIG 1000ML POUR BTL (IV SOLUTION) IMPLANT
PACK TOTAL JOINT (CUSTOM PROCEDURE TRAY) ×2 IMPLANT
PAD ARMBOARD 7.5X6 YLW CONV (MISCELLANEOUS) ×3 IMPLANT
PADDING CAST COTTON 6X4 STRL (CAST SUPPLIES) ×2 IMPLANT
SET HNDPC FAN SPRY TIP SCT (DISPOSABLE) ×1 IMPLANT
SPONGE GAUZE 4X4 12PLY (GAUZE/BANDAGES/DRESSINGS) ×2 IMPLANT
STAPLER VISISTAT 35W (STAPLE) ×2 IMPLANT
SUCTION FRAZIER TIP 10 FR DISP (SUCTIONS) IMPLANT
SUT VIC AB 0 CTX 36 (SUTURE) ×2
SUT VIC AB 0 CTX36XBRD ANTBCTR (SUTURE) ×1 IMPLANT
SUT VIC AB 1 CTX 36 (SUTURE) ×2
SUT VIC AB 1 CTX36XBRD ANBCTR (SUTURE) ×1 IMPLANT
SUT VIC AB 2-0 CT1 27 (SUTURE) ×2
SUT VIC AB 2-0 CT1 TAPERPNT 27 (SUTURE) ×1 IMPLANT
TOWEL OR 17X24 6PK STRL BLUE (TOWEL DISPOSABLE) ×2 IMPLANT
TOWEL OR 17X26 10 PK STRL BLUE (TOWEL DISPOSABLE) ×2 IMPLANT
TRAY FOLEY CATH 14FR (SET/KITS/TRAYS/PACK) ×2 IMPLANT
WATER STERILE IRR 1000ML POUR (IV SOLUTION) ×5 IMPLANT

## 2012-05-08 NOTE — Anesthesia Postprocedure Evaluation (Signed)
Anesthesia Post Note  Patient: Megan Hebert  Procedure(s) Performed: Procedure(s) (LRB): TOTAL KNEE ARTHROPLASTY (Left)  Anesthesia type: General  Patient location: PACU  Post pain: Pain level controlled and Adequate analgesia  Post assessment: Post-op Vital signs reviewed, Patient's Cardiovascular Status Stable, Respiratory Function Stable, Patent Airway and Pain level controlled  Last Vitals:  Filed Vitals:   05/08/12 1706  BP: 146/77  Pulse: 86  Temp:   Resp: 13    Post vital signs: Reviewed and stable  Level of consciousness: awake, alert  and oriented  Complications: No apparent anesthesia complications

## 2012-05-08 NOTE — Progress Notes (Signed)
Orthopedic Tech Progress Note Patient Details:  DELANIA FERG 06-07-1946 [id number] OHF and trapeze applied to bed CPM Left Knee CPM Left Knee: On Left Knee Flexion (Degrees): 60  Left Knee Extension (Degrees): 0    Asia R Thompson 05/08/2012, 6:20 PM

## 2012-05-08 NOTE — Progress Notes (Signed)

## 2012-05-08 NOTE — Anesthesia Preprocedure Evaluation (Signed)
Anesthesia Evaluation  Patient identified by MRN, date of birth, ID band Patient awake    Reviewed: Allergy & Precautions, H&P , NPO status , Patient's Chart, lab work & pertinent test results  History of Anesthesia Complications Negative for: history of anesthetic complications  Airway Mallampati: I  Neck ROM: Full    Dental  (+) Teeth Intact   Pulmonary neg pulmonary ROS,  breath sounds clear to auscultation        Cardiovascular negative cardio ROS  Rhythm:Regular Rate:Normal     Neuro/Psych    GI/Hepatic negative GI ROS, Neg liver ROS,   Endo/Other  negative endocrine ROS  Renal/GU negative Renal ROS     Musculoskeletal  (+) Arthritis -,   Abdominal   Peds  Hematology negative hematology ROS (+)   Anesthesia Other Findings   Reproductive/Obstetrics                           Anesthesia Physical Anesthesia Plan  ASA: I  Anesthesia Plan: General   Post-op Pain Management:    Induction: Intravenous  Airway Management Planned: LMA  Additional Equipment:   Intra-op Plan:   Post-operative Plan: Extubation in OR  Informed Consent:   Dental advisory given  Plan Discussed with: CRNA and Surgeon  Anesthesia Plan Comments:         Anesthesia Quick Evaluation

## 2012-05-08 NOTE — Progress Notes (Signed)
     Attempted to get rings off wtihout success.  Dr Jacklynn Bue notified.

## 2012-05-08 NOTE — Anesthesia Procedure Notes (Signed)
Anesthesia Regional Block:  Femoral nerve block  Pre-Anesthetic Checklist: ,, timeout performed, Correct Patient, Correct Site, Correct Laterality, Correct Procedure, Correct Position, site marked, Risks and benefits discussed, at surgeon's request and post-op pain management  Laterality: Left and Upper  Prep: Betadine       Needles:  Injection technique: Single-shot  Needle Type: Stimulator Needle - 40      Needle Gauge: 22 and 22 G  Needle insertion depth: 6 cm   Additional Needles:  Procedures: nerve stimulator Femoral nerve block  Nerve Stimulator or Paresthesia:  Response: Twitch elicited, 0.8 mA,   Additional Responses:   Narrative:  Start time: 05/08/2012 2:20 PM End time: 05/08/2012 2:30 PM Injection made incrementally with aspirations every 5 mL.  Performed by: Personally  Anesthesiologist: Alma Friendly, MD  Additional Notes: BP cuff, EKG monitors applied. Sedation begun. Femoral artery palpated for location of nerve. After nerve location anesthetic injected incrementally, slowly , and after neg aspirations. Tolerated well.  Femoral nerve block

## 2012-05-08 NOTE — Interval H&P Note (Signed)
History and Physical Interval Note:  05/08/2012 2:28 PM  Megan Hebert  has presented today for surgery, with the diagnosis of LEFT KNEE DEGENERATIVE JOINT DISEASE  The various methods of treatment have been discussed with the patient and family. After consideration of risks, benefits and other options for treatment, the patient has consented to  Procedure(s) (LRB): TOTAL KNEE ARTHROPLASTY (Left) as a surgical intervention .  The patient's history has been reviewed, patient examined, no change in status, stable for surgery.  I have reviewed the patients' chart and labs.  Questions were answered to the patient's satisfaction.     Nestor Lewandowsky

## 2012-05-08 NOTE — Transfer of Care (Signed)
Immediate Anesthesia Transfer of Care Note  Patient: Megan Hebert  Procedure(s) Performed: Procedure(s) (LRB): TOTAL KNEE ARTHROPLASTY (Left)  Patient Location: PACU  Anesthesia Type: General and GA combined with regional for post-op pain  Level of Consciousness: awake  Airway & Oxygen Therapy: Patient Spontanous Breathing and Patient connected to nasal cannula oxygen  Post-op Assessment: Report given to PACU RN  Post vital signs: Reviewed and stable  Complications: No apparent anesthesia complications

## 2012-05-08 NOTE — Op Note (Signed)
PATIENT ID:      Megan Hebert  MRN:     [id number] DOB/AGE:    18-Mar-1946 / 66 y.o.       OPERATIVE REPORT    DATE OF PROCEDURE:  05/08/2012       PREOPERATIVE DIAGNOSIS:   LEFT KNEE DEGENERATIVE JOINT DISEASE      There is no height or weight on file to calculate BMI.                                                        POSTOPERATIVE DIAGNOSIS:   LEFT KNEE DEGENERATIVE JOINT DISEASE                                                                      PROCEDURE:  Procedure(s): L TOTAL KNEE ARTHROPLASTY Using Depuy Sigma RP implants #2.5L Femur, #3Tibia, 10mm sigma RP bearing, 32 Patella     SURGEON: Vayda Dungee J    ASSISTANT:   Shirl Harris PA-C   (Present and scrubbed throughout the case, critical for assistance with exposure, retraction, instrumentation, and closure.)         ANESTHESIA: GET with Femoral Nerve Block  DRAINS: foley, 2 medium hemovac in knee   TOURNIQUET TIME:   COMPLICATIONS:  None     SPECIMENS: None   INDICATIONS FOR PROCEDURE: The patient has  LEFT KNEE DEGENERATIVE JOINT DISEASE, varus deformities, XR shows bone on bone arthritis. Patient has failed all conservative measures including anti-inflammatory medicines, narcotics, attempts at  exercise and weight loss, cortisone injections and viscosupplementation.  Risks and benefits of surgery have been discussed, questions answered.   DESCRIPTION OF PROCEDURE: The patient identified by armband, received  right femoral nerve block and IV antibiotics, in the holding area at [facility]. Patient taken to the operating room, appropriate anesthetic  monitors were attached General endotracheal anesthesia induced with  the patient in supine position, Foley catheter was inserted. Tourniquet  applied high to the operative thigh. Lateral post and foot positioner  applied to the table, the lower extremity was then prepped and draped  in usual sterile fashion from the ankle to the tourniquet.  Time-out procedure was performed. The limb was wrapped with an Esmarch bandage and the tourniquet inflated to 350 mmHg. We began the operation by making the anterior midline incision starting at handbreadth above the patella going over the patella 1 cm medial to and  4 cm distal to the tibial tubercle. Small bleeders in the skin and the  subcutaneous tissue identified and cauterized. Transverse retinaculum was incised and reflected medially and a medial parapatellar arthrotomy was accomplished. the patella was everted and theprepatellar fat pad resected. The superficial medial collateral  ligament was then elevated from anterior to posterior along the proximal  flare of the tibia and anterior half of the menisci resected. The knee was hyperflexed exposing bone on bone arthritis. Peripheral and notch osteophytes as well as the cruciate ligaments were then resected. We continued to  work our way around posteriorly along the proximal tibia, and externally  rotated the tibia  subluxing it out from underneath the femur. A McHale  retractor was placed through the notch and a lateral Hohmann retractor  placed, and we then drilled through the proximal tibia in line with the  axis of the tibia followed by an intramedullary guide rod and 2-degree  posterior slope cutting guide. The tibial cutting guide was pinned into place  allowing resection of 4 mm of bone medially and about 8 mm of bone  laterally because of her varus deformity. Satisfied with the tibial resection, we then  entered the distal femur 2 mm anterior to the PCL origin with the  intramedullary guide rod and applied the distal femoral cutting guide  set at 11mm, with 5 degrees of valgus. This was pinned along the  epicondylar axis. At this point, the distal femoral cut was accomplished without difficulty. We then sized for a #2.5 femoral component and pinned the guide in 3 degrees of external rotation.The chamfer cutting guide was pinned into  place. The anterior, posterior, and chamfer cuts were accomplished without difficulty followed by  the Sigma RP box cutting guide and the box cut. We also removed posterior osteophytes from the posterior femoral condyles. At this  time, the knee was brought into full extension. We checked our  extension and flexion gaps and found them symmetric at 10mm.  The patella thickness measured at 22 mm. We set the cutting guide at 14 and removed the posterior 9.5-10 mm  of the patella sized for 32 button and drilled the lollipop. The knee  was then once again hyperflexed exposing the proximal tibia. We sized for a #3 tibial base plate, applied the smokestack and the conical reamer followed by the the Delta fin keel punch. We then hammered into place the Sigma RP trial femoral component, inserted a 10-mm trial bearing, trial patellar button, and took the knee through range of motion from 0-130 degrees. No thumb pressure was required for patellar  tracking. At this point, all trial components were removed, a double batch of DePuy HV cement with 1500 mg of Zinacef was mixed and applied to all bony metallic mating surfaces except for the posterior condyles of the femur itself. In order, we  hammered into place the tibial tray and removed excess cement, the femoral component and removed excess cement, a 10-mm Sigma RP bearing  was inserted, and the knee brought to full extension with compression.  The patellar button was clamped into place, and excess cement  removed. While the cement cured the wound was irrigated out with normal saline solution pulse lavage, and medium Hemovac drains were placed from an anterolateral  approach. Ligament stability and patellar tracking were checked and found to be excellent. The parapatellar arthrotomy was closed with  running #1 Vicryl suture. The subcutaneous tissue with 0 and 2-0 undyed  Vicryl suture, and the skin with skin staples. A dressing of Xeroform,  4 x 4, dressing  sponges, Webril, and Ace wrap applied. The patient  awakened, extubated, and taken to recovery room without difficulty.   Gean Birchwood J 05/08/2012, 4:01 PM

## 2012-05-08 NOTE — Preoperative (Signed)
Beta Blockers   Reason not to administer Beta Blockers:Not Applicable 

## 2012-05-09 LAB — BASIC METABOLIC PANEL
Calcium: 8.5 mg/dL (ref 8.4–10.5)
GFR calc Af Amer: >90 mL/min (ref >90)
GFR calc non Af Amer: >90 mL/min (ref >90)
Glucose, Bld: 166 mg/dL — ABNORMAL HIGH (ref 70–99)
Sodium: 134 mEq/L — ABNORMAL LOW (ref 135–145)

## 2012-05-09 LAB — CBC
Hemoglobin: 10.2 g/dL — ABNORMAL LOW (ref 12.0–15.0)
MCH: 31.9 pg (ref 26.0–34.0)
MCHC: 33.3 g/dL (ref 30.0–36.0)
RDW: 13 % (ref 11.5–15.5)

## 2012-05-09 NOTE — Progress Notes (Signed)
Physical Therapy Treatment Patient Details Name: Megan Hebert MRN: [id number] DOB: [birth year] Today's Date: 05/09/2012 Time: 7829-5621 PT Time Calculation (min): 14 min  PT Assessment / Plan / Recommendation Comments on Treatment Session  pt presents s/p L TKA.  pt very motivated and moving well.      Follow Up Recommendations  Home health PT;Supervision - Intermittent    Barriers to Discharge        Equipment Recommendations  None recommended by PT    Recommendations for Other Services    Frequency 7X/week   Plan Discharge plan remains appropriate;Frequency remains appropriate    Precautions / Restrictions Restrictions Weight Bearing Restrictions: Yes LLE Weight Bearing: Weight bearing as tolerated   Pertinent Vitals/Pain 5/10    Mobility  Bed Mobility Bed Mobility: Supine to Sit;Sitting - Scoot to [place] of Bed;Sit to Supine Supine to Sit: 4: Min guard Sitting - Scoot to [place] of Bed: 5: Supervision Sit to Supine: 6: Modified independent (Device/Increase time) Details for Bed Mobility Assistance: pt demos good technique, but needs increased time.   Transfers Transfers: Sit to Stand;Stand to Sit Sit to Stand: 4: Min guard;With upper extremity assist;From bed Stand to Sit: 4: Min guard;With upper extremity assist;To bed Details for Transfer Assistance: cues for UE use Ambulation/Gait Ambulation/Gait Assistance: 4: Min guard Ambulation Distance (Feet): 160 Feet Assistive device: Rolling walker Ambulation/Gait Assistance Details: cues for increased step length R and more fluid gait pattern.   Gait Pattern: Step-through pattern;Decreased step length - right;Decreased stance time - left Stairs: No Wheelchair Mobility Wheelchair Mobility: No    Exercises Total Joint Exercises Long Arc Quad: AAROM;Left;10 reps Knee Flexion: AAROM;Left;10 reps   PT Diagnosis:    PT Problem List:   PT Treatment Interventions:     PT Goals Acute Rehab PT Goals Time For Goal  Achievement: 05/16/12 PT Goal: Supine/Side to Sit - Progress: Progressing toward goal PT Goal: Sit to Supine/Side - Progress: Met PT Goal: Sit to Stand - Progress: Progressing toward goal PT Goal: Stand to Sit - Progress: Progressing toward goal PT Goal: Ambulate - Progress: Progressing toward goal PT Goal: Perform Home Exercise Program - Progress: Progressing toward goal  Visit Information  Last PT Received On: 05/09/12 Assistance Needed: +1    Subjective Data  Subjective: I'm a little tired.     Cognition  Overall Cognitive Status: Appears within functional limits for tasks assessed/performed Arousal/Alertness: Awake/alert Orientation Level: Oriented X4 / Intact Behavior During Session: [facility] for tasks performed    Balance  Balance Balance Assessed: No  End of Session PT - End of Session Equipment Utilized During Treatment: Gait belt Activity Tolerance: Patient tolerated treatment well Patient left: in bed;in CPM;with call bell/phone within reach;with family/visitor present Nurse Communication: Mobility status   GP     Sunny Schlein, [place] [phone] 05/09/2012, 3:19 PM

## 2012-05-09 NOTE — Progress Notes (Signed)
Order received, chart reviewed, in to speak to pt about role of OT. Pt feels since she had her other knee done just over a year ago that she does not need a refresher on self-care. Eval not completed. Acute OT will sign off.  Ignacia Palma, [place] [phone] 05/09/2012

## 2012-05-09 NOTE — Evaluation (Signed)
Physical Therapy Evaluation Patient Details Name: Megan Hebert MRN: [id number] DOB: 1946/11/10 Today's Date: 05/09/2012 Time: 4098-1191 PT Time Calculation (min): 22 min  PT Assessment / Plan / Recommendation Clinical Impression  pt presents s/p L TKA.  pt should make great progress to D/C home.      PT Assessment  Patient needs continued PT services    Follow Up Recommendations  Home health PT;Supervision - Intermittent    Barriers to Discharge None      Equipment Recommendations  None recommended by PT    Recommendations for Other Services     Frequency 7X/week    Precautions / Restrictions Precautions Precautions: Knee Restrictions Weight Bearing Restrictions: Yes LLE Weight Bearing: Weight bearing as tolerated   Pertinent Vitals/Pain 5/10      Mobility  Bed Mobility Bed Mobility: Not assessed Transfers Transfers: Sit to Stand;Stand to Sit Sit to Stand: 4: Min assist;With upper extremity assist;From chair/3-in-1;With armrests Stand to Sit: 4: Min guard;With upper extremity assist;With armrests;To chair/3-in-1 Details for Transfer Assistance: cues for UE sue, positioning of LEs.   Ambulation/Gait Ambulation/Gait Assistance: 4: Min guard Ambulation Distance (Feet): 100 Feet Assistive device: Rolling walker Ambulation/Gait Assistance Details: Cues for sequencing, upright posture, increasing WBing on L LE.   Gait Pattern: Step-to pattern;Decreased step length - right;Decreased stance time - left;Trunk flexed Stairs: No Wheelchair Mobility Wheelchair Mobility: No    Exercises Total Joint Exercises Ankle Circles/Pumps: AROM;Both;10 reps Quad Sets: AROM;Both;10 reps Long Arc Quad: AAROM;Left;10 reps Knee Flexion: AAROM;Left;10 reps Goniometric ROM: ~15-90   PT Diagnosis: Abnormality of gait;Acute pain  PT Problem List: Decreased strength;Decreased range of motion;Decreased activity tolerance;Decreased balance;Decreased mobility;Decreased knowledge of use  of DME;Pain PT Treatment Interventions: DME instruction;Gait training;Stair training;Functional mobility training;Therapeutic activities;Therapeutic exercise;Balance training;Patient/family education   PT Goals Acute Rehab PT Goals PT Goal Formulation: With patient Time For Goal Achievement: 05/16/12 Potential to Achieve Goals: Good Pt will go Supine/Side to Sit: with modified independence PT Goal: Supine/Side to Sit - Progress: Goal set today Pt will go Sit to Supine/Side: with modified independence PT Goal: Sit to Supine/Side - Progress: Goal set today Pt will go Sit to Stand: with modified independence PT Goal: Sit to Stand - Progress: Goal set today Pt will go Stand to Sit: with modified independence PT Goal: Stand to Sit - Progress: Goal set today Pt will Ambulate: >150 feet;with modified independence;with rolling walker PT Goal: Ambulate - Progress: Goal set today Pt will Go Up / Down Stairs: 3-5 stairs;with supervision;with least restrictive assistive device PT Goal: Up/Down Stairs - Progress: Goal set today Pt will Perform Home Exercise Program: Independently PT Goal: Perform Home Exercise Program - Progress: Goal set today  Visit Information  Last PT Received On: 05/09/12 Assistance Needed: +1    Subjective Data  Subjective: I'm ready to get up.   Patient Stated Goal: Home   Prior Functioning  Home Living Lives With: Spouse Available Help at Discharge: Family;Available 24 hours/day Type of Home: House Home Access: Stairs to enter [employer] of Steps: 4 Entrance Stairs-Rails: Left Home Layout: One level (Bonus roomupstairs) Bathroom Shower/Tub: Health visitor: Standard Bathroom Accessibility: Yes How Accessible: Accessible via walker Home Adaptive Equipment: Built-in shower seat;Straight cane;Walker - rolling Prior Function Level of Independence: Independent Able to Take Stairs?: Yes Driving: Yes Communication Communication: No  difficulties    Cognition  Overall Cognitive Status: Appears within functional limits for tasks assessed/performed Arousal/Alertness: Awake/alert Orientation Level: Oriented X4 / Intact Behavior During Session: [facility] for  tasks performed    Extremity/Trunk Assessment Right Lower Extremity Assessment RLE ROM/Strength/Tone: WFL for tasks assessed RLE Sensation: WFL - Light Touch Left Lower Extremity Assessment LLE ROM/Strength/Tone: Deficits LLE ROM/Strength/Tone Deficits: AAROM ~15-90 LLE Sensation: WFL - Light Touch   Balance Balance Balance Assessed: No  End of Session PT - End of Session Equipment Utilized During Treatment: Gait belt Activity Tolerance: Patient tolerated treatment well Patient left: in chair;with call bell/phone within reach;with family/visitor present Nurse Communication: Mobility status CPM Left Knee CPM Left Knee: Off  GP     Megan Hebert, [place] [phone] 05/09/2012, 12:40 PM

## 2012-05-09 NOTE — Care Management Note (Signed)
    Page 1 of 1   05/09/2012     11:04:15 AM   CARE MANAGEMENT NOTE 05/09/2012  Patient:  HILJA, KINTZEL   Account Number:  [id number]  Date Initiated:  05/09/2012  Documentation initiated by:  Anette Guarneri  Subjective/Objective Assessment:   POD#1 Left TKA  Lives at home with husband,  Has DME, [employer] to deliver CPM     Action/Plan:   Home with [facility] services   Anticipated DC Date:  05/11/2012   Anticipated DC Plan:  HOME W HOME HEALTH SERVICES         Choice offered to / List presented to:             Status of service:  In process, will continue to follow Medicare Important Message given?   (If response is "NO", the following Medicare IM given date fields will be blank) Date Medicare IM given:   Date Additional Medicare IM given:    Discharge Disposition:  HOME W HOME HEALTH SERVICES  Per UR Regulation:  Reviewed for med. necessity/level of care/duration of stay  If discussed at Long Length of Stay Meetings, dates discussed:    Comments:  05/09/12 11:00 Anette Guarneri RN/CM Spoke with patient regarding d/c planning Per patient she has RW/3n1 at home [facility] services with [facility] have been pre-arranged. CPM to be delivered to home by [employer] after d/c home.

## 2012-05-09 NOTE — Progress Notes (Signed)
Patient ID: KELSA JAWOROWSKI, female   DOB: 03-02-1946, 66 y.o.   MRN: [id number] PATIENT ID: TAHIRA OLIVAREZ  MRN: [id number]  DOB/AGE:  11/11/1946 / 66 y.o.  1 Day Post-Op Procedure(s) (LRB): TOTAL KNEE ARTHROPLASTY (Left)    PROGRESS NOTE Subjective: Patient is alert, oriented, 1x Nausea, no Vomiting, yes passing gas, no Bowel Movement. Taking PO well. Denies SOB, Chest or Calf Pain. Using Incentive Spirometer, PAS in place. Ambulate WBAT, already OOB, CPM 0-30 Patient reports pain as 3 on 0-10 scale  .    Objective: Vital signs in last 24 hours: Filed Vitals:   05/09/12 0400 05/09/12 0500 05/09/12 0644 05/09/12 0800  BP:   131/73   Pulse:   99   Temp:   98.2 F (36.8 C)   TempSrc:   Oral   Resp: 16  18 16   Height:  5\' 5"  (1.651 m)    Weight:  53.071 kg (117 lb)    SpO2: 0%  98% 96%      Intake/Output from previous day: I/O last 3 completed shifts: In: 2980 [P.O.:480; I.V.:2500] Out: 1375 [Urine:1000; Drains:375]   Intake/Output this shift:     LABORATORY DATA:  Basename 05/09/12 0455  WBC 7.5  HGB 10.2*  HCT 30.6*  PLT 182  NA 134*  K 4.8  CL 99  CO2 26  BUN 11  CREATININE 0.61  GLUCOSE 166*  GLUCAP --  INR --  CALCIUM 8.5    Examination: Neurologically intact ABD soft Neurovascular intact Sensation intact distally Intact pulses distally Dorsiflexion/Plantar flexion intact Incision: no drainage No cellulitis present Compartment soft} Blood and plasma separated in drain indicating minimal recent drainage, drain pulled without difficulty.  Assessment:   1 Day Post-Op Procedure(s) (LRB): TOTAL KNEE ARTHROPLASTY (Left) ADDITIONAL DIAGNOSIS:    Plan: PT/OT WBAT, CPM 5/hrs day until ROM 0-90 degrees, then D/C CPM DVT Prophylaxis:  SCDx72hrs, ASA 325 mg BID x 2 weeks DISCHARGE PLAN: Home DISCHARGE NEEDS: HHPT, HHRN, CPM, Walker and 3-in-1 comode seat     Cleotha Tsang J 05/09/2012, 9:29 AM

## 2012-05-09 NOTE — Progress Notes (Signed)
UR COMPLETED  

## 2012-05-10 ENCOUNTER — Encounter (HOSPITAL_COMMUNITY): Payer: Self-pay | Admitting: Orthopedic Surgery

## 2012-05-10 DIAGNOSIS — M1712 Unilateral primary osteoarthritis, left knee: Secondary | ICD-10-CM | POA: Diagnosis present

## 2012-05-10 LAB — CBC
MCH: 32.6 pg (ref 26.0–34.0)
MCHC: 34 g/dL (ref 30.0–36.0)
MCV: 95.8 fL (ref 78.0–100.0)
Platelets: 189 10*3/uL (ref 150–400)
RBC: 3.13 MIL/uL — ABNORMAL LOW (ref 3.87–5.11)
RDW: 13.3 % (ref 11.5–15.5)

## 2012-05-10 MED ORDER — HYDROMORPHONE HCL 2 MG PO TABS: 2.0000 mg | ORAL_TABLET | ORAL | Status: AC | PRN

## 2012-05-10 MED ORDER — PROMETHAZINE HCL 12.5 MG PO TABS: 12.5000 mg | ORAL_TABLET | Freq: Four times a day (QID) | ORAL | Status: DC | PRN

## 2012-05-10 MED ORDER — ASPIRIN 325 MG PO TBEC: 325.0000 mg | DELAYED_RELEASE_TABLET | Freq: Two times a day (BID) | ORAL | Status: AC

## 2012-05-10 MED ORDER — OXYCODONE-ACETAMINOPHEN 5-325 MG PO TABS: 1.0000 | ORAL_TABLET | ORAL | Status: DC | PRN

## 2012-05-10 MED ORDER — METHOCARBAMOL 500 MG PO TABS: 500.0000 mg | ORAL_TABLET | Freq: Four times a day (QID) | ORAL | Status: AC | PRN

## 2012-05-10 NOTE — Progress Notes (Signed)
PATIENT ID: Megan Hebert  MRN: [id number]  DOB/AGE:  03/31/1946 / 66 y.o.  2 Days Post-Op Procedure(s) (LRB): TOTAL KNEE ARTHROPLASTY (Left)    PROGRESS NOTE Subjective: Patient is alert, oriented, no Nausea, no Vomiting, yes passing gas, no Bowel Movement. Taking PO well. Denies SOB, Chest or Calf Pain. Using Incentive Spirometer, PAS in place. Ambulating well with PT. Patient reports pain as moderate  .    Objective: Vital signs in last 24 hours: Filed Vitals:   05/09/12 1300 05/09/12 1600 05/09/12 2145 05/10/12 0606  BP: 114/54  116/58 112/63  Pulse: 85  99 89  Temp: 98.3 F (36.8 C)  99.1 F (37.3 C) 98.7 F (37.1 C)  TempSrc: Oral  Oral   Resp: 18 14 18 18   Height:      Weight:      SpO2: 98% 97% 97% 97%      Intake/Output from previous day: I/O last 3 completed shifts: In: 3575 [P.O.:1200; I.V.:2375] Out: 1375 [Urine:1000; Drains:375]   Intake/Output this shift:     LABORATORY DATA:  Basename 05/10/12 0632 05/09/12 0455  WBC 8.0 7.5  HGB 10.2* 10.2*  HCT 30.0* 30.6*  PLT 189 182  NA -- 134*  K -- 4.8  CL -- 99  CO2 -- 26  BUN -- 11  CREATININE -- 0.61  GLUCOSE -- 166*  GLUCAP -- --  INR -- --  CALCIUM -- 8.5    Examination: Neurologically intact ABD soft Neurovascular intact Sensation intact distally Intact pulses distally Dorsiflexion/Plantar flexion intact Incision: dressing C/D/I}  Assessment:   2 Days Post-Op Procedure(s) (LRB): TOTAL KNEE ARTHROPLASTY (Left) ADDITIONAL DIAGNOSIS:  none  Plan: PT/OT WBAT, CPM 5/hrs day until ROM 0-90 degrees, then D/C CPM DVT Prophylaxis:  SCDx72hrs, ASA 325 mg BID x 2 weeks Dressing change today DISCHARGE PLAN: Home today DISCHARGE NEEDS: HHPT, HHRN, CPM, Walker and 3-in-1 comode seat     Megan Hebert M. 05/10/2012, 10:39 AM

## 2012-05-10 NOTE — Progress Notes (Signed)
PT Progress Note:    05/10/12 1300  PT Visit Information  Last PT Received On 05/10/12  Assistance Needed +1  PT Time Calculation  PT Start Time 0741  PT Stop Time 0752  PT Time Calculation (min) 11 min  Precautions  Precautions Knee  Restrictions  LLE Weight Bearing WBAT  Cognition  Overall Cognitive Status Appears within functional limits for tasks assessed/performed  Arousal/Alertness Awake/alert  Orientation Level Oriented X4 / Intact  Behavior During Session [facility] for tasks performed  Bed Mobility  Bed Mobility Supine to Sit  Supine to Sit 6: Modified independent (Device/Increase time);HOB flat  Transfers  Transfers Sit to Stand;Stand to Sit  Sit to Stand 5: Supervision;With upper extremity assist;From bed  Stand to Sit 5: Supervision;With upper extremity assist;With armrests;To chair/3-in-1  Ambulation/Gait  Ambulation/Gait Assistance 5: Supervision  Ambulation Distance (Feet) 200 Feet  Assistive device Standard walker  Ambulation/Gait Assistance Details Pt progressing with fluidity/smoothness of gait  Gait Pattern Step-through pattern;Decreased step length - right;Decreased stance time - left  Stairs Yes  Stairs Assistance 4: Min assist  Stairs Assistance Details (indicate cue type and reason) Utilized rail on Lt & HHA on Rt.  Cues for sequencing & technique.   Stair Management Technique One rail Left;Other (comment) (HHA Rt. )  Number of Stairs 4   Wheelchair Mobility  Wheelchair Mobility No  Balance  Balance Assessed No  Exercises  Exercises Total Joint  Total Joint Exercises  Ankle Circles/Pumps AROM;Both;15 reps  Quad Sets AROM;Both;15 reps  Heel Slides AROM;Right;15 reps  Hip ABduction/ADduction AROM;Right;15 reps  Straight Leg Raises AROM;Right;15 reps  PT - End of Session  Equipment Utilized During Treatment Gait belt  Activity Tolerance Patient tolerated treatment well  Patient left in chair;with call bell/phone within reach;with family/visitor  present  PT - Assessment/Plan  PT Plan Discharge plan remains appropriate;Frequency remains appropriate  PT Frequency 7X/week  Follow Up Recommendations Home health PT;Supervision - Intermittent  Equipment Recommended None recommended by PT  Acute Rehab PT Goals  Time For Goal Achievement 05/16/12  Potential to Achieve Goals Good  PT Goal: Supine/Side to Sit - Progress Progressing toward goal  PT Goal: Sit to Stand - Progress Progressing toward goal  PT Goal: Stand to Sit - Progress Progressing toward goal  PT Goal: Ambulate - Progress Progressing toward goal  PT Goal: Up/Down Stairs - Progress Progressing toward goal  PT Goal: Perform Home Exercise Program - Progress Progressing toward goal     Verdell Face, [place] [phone] 05/10/2012

## 2012-05-10 NOTE — Discharge Summary (Signed)
Patient ID: Megan Hebert MRN: [id number] DOB/AGE: August 14, 1946 66 y.o.  Admit date: 05/08/2012 Discharge date: 05/10/2012  Admission Diagnoses:  Active Problems:  Osteoarthritis of left knee   Discharge Diagnoses:  Same  Past Medical History  Diagnosis Date  . No pertinent past medical history     Surgeries: Procedure(s): TOTAL KNEE ARTHROPLASTY on 05/08/2012   Consultants:    Discharged Condition: Improved  Hospital Course: Megan Hebert is an 66 y.o. female who was admitted 05/08/2012 for operative treatment of<principal problem not specified>. Patient has severe unremitting pain that affects sleep, daily activities, and work/hobbies. After pre-op clearance the patient was taken to the operating room on 05/08/2012 and underwent  Procedure(s): TOTAL KNEE ARTHROPLASTY.    Patient was given perioperative antibiotics: Anti-infectives     Start     Dose/Rate Route Frequency Ordered Stop   05/08/12 1538   tobramycin (NEBCIN) powder  Status:  Discontinued          As needed 05/08/12 1538 05/08/12 1629   05/07/12 1521   ceFAZolin (ANCEF) IVPB 2 g/50 mL premix        2 g 100 mL/hr over 30 Minutes Intravenous 60 min pre-op 05/07/12 1521 05/08/12 1451           Patient was given sequential compression devices, early ambulation, and chemoprophylaxis to prevent DVT.  Patient benefited maximally from hospital stay and there were no complications.    Recent vital signs: Patient Vitals for the past 24 hrs:  BP Temp Temp src Pulse Resp SpO2  05/10/12 0606 112/63 mmHg 98.7 F (37.1 C) - 89  18  97 %  25-May-2012 2145 116/58 mmHg 99.1 F (37.3 C) Oral 99  18  97 %  2012/05/25 1600 - - - - 14  97 %  05-25-2012 1300 114/54 mmHg 98.3 F (36.8 C) Oral 85  18  98 %  2012/05/25 1107 - - - - 14  96 %     Recent laboratory studies:  Basename 05/10/12 0632 2012-05-25 0455  WBC 8.0 7.5  HGB 10.2* 10.2*  HCT 30.0* 30.6*  PLT 189 182  NA -- 134*  K -- 4.8  CL -- 99  CO2 -- 26  BUN  -- 11  CREATININE -- 0.61  GLUCOSE -- 166*  INR -- --  CALCIUM -- 8.5     Discharge Medications:   Medication List  As of 05/10/2012 10:46 AM   STOP taking these medications         TYLENOL ARTHRITIS PAIN PO         TAKE these medications         aspirin 325 MG EC tablet   Take 1 tablet (325 mg total) by mouth 2 (two) times daily.      CoQ10 100 MG Caps   Take 1 tablet by mouth daily.      Fish Oil 1000 MG Caps   Take 1 capsule by mouth daily.      methocarbamol 500 MG tablet   Commonly known as: ROBAXIN   Take 1 tablet (500 mg total) by mouth every 6 (six) hours as needed.      multivitamin with minerals Tabs   Take 1 tablet by mouth daily.      oxyCODONE-acetaminophen 5-325 MG per tablet   Commonly known as: PERCOCET   Take 1-2 tablets by mouth every 4 (four) hours as needed.      promethazine 12.5 MG tablet   Commonly known as: PHENERGAN  Take 1 tablet (12.5 mg total) by mouth every 6 (six) hours as needed for nausea.      vitamin C 1000 MG tablet   Take 1,000 mg by mouth daily.      Vitamin D3 2000 UNITS Tabs   Take 1 Units by mouth daily.            Diagnostic Studies: Dg Chest 2 View  05/03/2012  *RADIOLOGY REPORT*  Clinical Data: Preop for left knee arthroplasty  CHEST - 2 VIEW  Comparison: 03/27/2011  Findings: Cardiomediastinal silhouette is stable.  No acute infiltrate or pleural effusion.  No pulmonary edema.  Stable degenerative changes in the thoracic spine kyphosis.  Mild hyperinflation again noted.  IMPRESSION: No active disease.  No significant change.  Original Report Authenticated By: Natasha Mead, M.D.    Disposition: 06-Home-Health Care Svc  Discharge Orders    Future Orders Please Complete By Expires   Increase activity slowly      Walker       May shower / Bathe      Driving Restrictions      Comments:   No driving for 2 weeks.   Change dressing (specify)      Comments:   Dressing change as needed.   Call MD for:  temperature  >100.4      Call MD for:  severe uncontrolled pain      Call MD for:  redness, tenderness, or signs of infection (pain, swelling, redness, odor or green/yellow discharge around incision site)      Discharge instructions      Comments:   F/U with Dr. Turner Daniels as scheduled.         SignedHazle Nordmann. 05/10/2012, 10:46 AM

## 2012-06-25 ENCOUNTER — Other Ambulatory Visit: Payer: Self-pay | Admitting: Internal Medicine

## 2012-06-25 DIAGNOSIS — Z1231 Encounter for screening mammogram for malignant neoplasm of breast: Secondary | ICD-10-CM

## 2012-07-31 ENCOUNTER — Ambulatory Visit
Admission: RE | Admit: 2012-07-31 | Discharge: 2012-07-31 | Disposition: A | Discharge status: Home / Self Care | Payer: Medicare Other | Source: Ambulatory Visit | Attending: Internal Medicine | Admitting: Internal Medicine

## 2012-07-31 DIAGNOSIS — Z1231 Encounter for screening mammogram for malignant neoplasm of breast: Secondary | ICD-10-CM

## 2013-06-24 ENCOUNTER — Other Ambulatory Visit: Payer: Self-pay

## 2013-06-24 DIAGNOSIS — Z1231 Encounter for screening mammogram for malignant neoplasm of breast: Secondary | ICD-10-CM

## 2013-08-05 ENCOUNTER — Ambulatory Visit
Admission: RE | Admit: 2013-08-05 | Discharge: 2013-08-05 | Disposition: A | Discharge status: Home / Self Care | Payer: Medicare Other | Source: Ambulatory Visit

## 2013-08-05 DIAGNOSIS — Z1231 Encounter for screening mammogram for malignant neoplasm of breast: Secondary | ICD-10-CM

## 2014-06-30 ENCOUNTER — Other Ambulatory Visit: Payer: Self-pay

## 2014-06-30 DIAGNOSIS — Z1231 Encounter for screening mammogram for malignant neoplasm of breast: Secondary | ICD-10-CM

## 2014-08-06 ENCOUNTER — Encounter (INDEPENDENT_AMBULATORY_CARE_PROVIDER_SITE_OTHER): Payer: Self-pay

## 2014-08-06 ENCOUNTER — Ambulatory Visit
Admission: RE | Admit: 2014-08-06 | Discharge: 2014-08-06 | Disposition: A | Discharge status: Home / Self Care | Payer: 59 | Source: Ambulatory Visit

## 2014-08-06 DIAGNOSIS — Z1231 Encounter for screening mammogram for malignant neoplasm of breast: Secondary | ICD-10-CM

## 2015-07-02 ENCOUNTER — Other Ambulatory Visit: Payer: Self-pay

## 2015-07-02 DIAGNOSIS — Z1231 Encounter for screening mammogram for malignant neoplasm of breast: Secondary | ICD-10-CM

## 2015-08-11 ENCOUNTER — Ambulatory Visit
Admission: RE | Admit: 2015-08-11 | Discharge: 2015-08-11 | Disposition: A | Discharge status: Home / Self Care | Payer: Medicare Other | Source: Ambulatory Visit

## 2015-08-11 DIAGNOSIS — Z1231 Encounter for screening mammogram for malignant neoplasm of breast: Secondary | ICD-10-CM

## 2016-07-14 ENCOUNTER — Other Ambulatory Visit: Payer: Self-pay | Admitting: Internal Medicine

## 2016-07-14 DIAGNOSIS — Z1231 Encounter for screening mammogram for malignant neoplasm of breast: Secondary | ICD-10-CM

## 2016-08-21 ENCOUNTER — Ambulatory Visit
Admission: RE | Admit: 2016-08-21 | Discharge: 2016-08-21 | Disposition: A | Discharge status: Home / Self Care | Payer: Medicare Other | Source: Ambulatory Visit | Attending: Internal Medicine | Admitting: Internal Medicine

## 2016-08-21 DIAGNOSIS — Z1231 Encounter for screening mammogram for malignant neoplasm of breast: Secondary | ICD-10-CM

## 2016-11-15 ENCOUNTER — Other Ambulatory Visit: Payer: Self-pay | Admitting: Gastroenterology

## 2017-01-15 ENCOUNTER — Ambulatory Visit (HOSPITAL_COMMUNITY): Payer: Medicare Other | Admitting: Registered Nurse

## 2017-01-15 ENCOUNTER — Ambulatory Visit (HOSPITAL_COMMUNITY)
Admission: RE | Admit: 2017-01-15 | Discharge: 2017-01-15 | Disposition: A | Discharge status: Home / Self Care | Payer: Medicare Other | Source: Ambulatory Visit | Attending: Gastroenterology | Admitting: Gastroenterology

## 2017-01-15 ENCOUNTER — Encounter (HOSPITAL_COMMUNITY): Payer: Self-pay | Admitting: *Deleted

## 2017-01-15 ENCOUNTER — Encounter (HOSPITAL_COMMUNITY)
Admission: RE | Disposition: A | Discharge status: Home / Self Care | Payer: Self-pay | Source: Ambulatory Visit | Attending: Gastroenterology

## 2017-01-15 DIAGNOSIS — E7800 Pure hypercholesterolemia, unspecified: Secondary | ICD-10-CM | POA: Insufficient documentation

## 2017-01-15 DIAGNOSIS — M1990 Unspecified osteoarthritis, unspecified site: Secondary | ICD-10-CM | POA: Diagnosis not present

## 2017-01-15 DIAGNOSIS — Z1211 Encounter for screening for malignant neoplasm of colon: Secondary | ICD-10-CM | POA: Insufficient documentation

## 2017-01-15 DIAGNOSIS — Z96653 Presence of artificial knee joint, bilateral: Secondary | ICD-10-CM | POA: Insufficient documentation

## 2017-01-15 HISTORY — PX: COLONOSCOPY WITH PROPOFOL: SHX5780

## 2017-01-15 SURGERY — COLONOSCOPY WITH PROPOFOL
Anesthesia: Monitor Anesthesia Care

## 2017-01-15 MED ORDER — PROPOFOL 10 MG/ML IV BOLUS
INTRAVENOUS | Status: AC
  Filled 2017-01-15: qty 40

## 2017-01-15 MED ORDER — SODIUM CHLORIDE 0.9 % IV SOLN: INTRAVENOUS | Status: DC

## 2017-01-15 MED ORDER — PROPOFOL 500 MG/50ML IV EMUL
INTRAVENOUS | Status: DC | PRN
  Administered 2017-01-15: 275 ug/kg/min via INTRAVENOUS

## 2017-01-15 MED ORDER — LACTATED RINGERS IV SOLN
INTRAVENOUS | Status: DC
  Administered 2017-01-15: 1000 mL via INTRAVENOUS

## 2017-01-15 SURGICAL SUPPLY — 22 items

## 2017-01-15 NOTE — H&P (Signed)
Procedure: Screening colonoscopy. Normal screening colonoscopy was performed on 09/25/2006  History: The patient is a 71 year old female born [birth year]. She is scheduled to undergo a repeat screening colonoscopy today.  Past medical history: Osteoporosis. Migraine headache syndrome. Hypercholesterolemia. Osteoarthritis of the knees. Bilateral knee replacement surgeries. Cataract surgery.  Medication allergies: Cephalosporins cause rash  Exam: The patient is alert and lying comfortably on the endoscopy stretcher. Abdomen is soft and nontender to palpation. Lungs are clear to auscultation. Cardiac exam reveals a regular rhythm.  Plan: Proceed with screening colonoscopy

## 2017-01-15 NOTE — Anesthesia Preprocedure Evaluation (Signed)
Anesthesia Evaluation  Patient identified by MRN, date of birth, ID band Patient awake    Reviewed: Allergy & Precautions  Airway Mallampati: II  TM Distance: >3 FB     Dental   Pulmonary neg pulmonary ROS,    breath sounds clear to auscultation       Cardiovascular negative cardio ROS   Rhythm:Regular Rate:Normal     Neuro/Psych negative neurological ROS     GI/Hepatic Neg liver ROS, History noted CG   Endo/Other    Renal/GU negative Renal ROS     Musculoskeletal  (+) Arthritis ,   Abdominal   Peds  Hematology   Anesthesia Other Findings   Reproductive/Obstetrics                             Anesthesia Physical Anesthesia Plan  ASA: II  Anesthesia Plan: MAC   Post-op Pain Management:    Induction: Intravenous  Airway Management Planned: Simple Face Mask  Additional Equipment:   Intra-op Plan:   Post-operative Plan:   Informed Consent: I have reviewed the patients History and Physical, chart, labs and discussed the procedure including the risks, benefits and alternatives for the proposed anesthesia with the patient or authorized representative who has indicated his/her understanding and acceptance.   Dental advisory given  Plan Discussed with: CRNA and Anesthesiologist  Anesthesia Plan Comments:         Anesthesia Quick Evaluation

## 2017-01-15 NOTE — Op Note (Signed)
[facility] Patient Name: Megan Hebert Procedure Date: 01/15/2017 MRN: [id number] Attending MD: Charolett Bumpers , MD Date of Birth: 1946/02/15 CSN: [id number] Age: 71 Admit Type: Outpatient Procedure:                Colonoscopy Indications:              Screening for colorectal malignant neoplasm Providers:                Charolett Bumpers, MD, Omelia Blackwater RN, RN,                            Beryle Beams, Technician, Anastasio Champion, CRNA Referring MD:              Medicines:                Propofol per Anesthesia Complications:            No immediate complications. Estimated Blood Loss:     Estimated blood loss: none. Procedure:                Pre-Anesthesia Assessment:                           - Prior to the procedure, a History and Physical                            was performed, and patient medications and                            allergies were reviewed. The patient's tolerance of                            previous anesthesia was also reviewed. The risks                            and benefits of the procedure and the sedation                            options and risks were discussed with the patient.                            All questions were answered, and informed consent                            was obtained. Prior Anticoagulants: The patient has                            taken no previous anticoagulant or antiplatelet                            agents. ASA Grade Assessment: II - A patient with                            mild systemic disease. After reviewing the risks  and benefits, the patient was deemed in                            satisfactory condition to undergo the procedure.                           After obtaining informed consent, the colonoscope                            was passed under direct vision. Throughout the                            procedure, the patient's blood pressure, pulse, and                        oxygen saturations were monitored continuously. The                            EC-[id number] ([id number]) scope was introduced through                            the anus and advanced to the the cecum, identified                            by appendiceal orifice and ileocecal valve. The                            colonoscopy was performed without difficulty. The                            patient tolerated the procedure well. The quality                            of the bowel preparation was good. The terminal                            ileum, the ileocecal valve, the appendiceal orifice                            and the rectum were photographed. Scope In: 8:42:09 AM Scope Out: 8:56:00 AM Scope Withdrawal Time: 0 hours 7 minutes 2 seconds  Total Procedure Duration: 0 hours 13 minutes 51 seconds  Findings:      The perianal and digital rectal examinations were normal.      The entire examined colon appeared normal. Impression:               - The entire examined colon is normal.                           - No specimens collected. Moderate Sedation:      N/A- Per Anesthesia Care Recommendation:           - Patient has a contact number available for  emergencies. The signs and symptoms of potential                            delayed complications were discussed with the                            patient. Return to normal activities tomorrow.                            Written discharge instructions were provided to the                            patient.                           - Repeat colonoscopy is not recommended for                            screening purposes.                           - Resume previous diet.                           - Continue present medications. Procedure Code(s):        --- Professional ---                           Z6109G0121, Colorectal cancer screening; colonoscopy on                            individual not meeting  criteria for high risk Diagnosis Code(s):        --- Professional ---                           Z12.11, Encounter for screening for malignant                            neoplasm of colon CPT copyright 2016 [employer]. All rights reserved. The codes documented in this report are preliminary and upon coder review may  be revised to meet current compliance requirements. Danise EdgeMartin Constance Whittle, MD Charolett BumpersMartin K Jahara Dail, MD 01/15/2017 9:02:42 AM This report has been signed electronically. Number of Addenda: 0

## 2017-01-15 NOTE — Anesthesia Postprocedure Evaluation (Signed)
Anesthesia Post Note  Patient: Megan Hebert  Procedure(s) Performed: Procedure(s) (LRB): COLONOSCOPY WITH PROPOFOL (N/A)  Patient location during evaluation: Endoscopy Anesthesia Type: MAC Level of consciousness: awake Pain management: pain level controlled Vital Signs Assessment: post-procedure vital signs reviewed and stable Respiratory status: spontaneous breathing Cardiovascular status: stable Anesthetic complications: no       Last Vitals:  Vitals:   01/15/17 0746 01/15/17 0904  BP: (!) 145/76 (!) 115/51  Pulse: 81 79  Resp: 13 19  Temp: 36.6 C 36.6 C    Last Pain:  Vitals:   01/15/17 0904  TempSrc: Oral                 Eulon Allnutt

## 2017-01-15 NOTE — Discharge Instructions (Signed)

## 2017-01-15 NOTE — Transfer of Care (Signed)
Immediate Anesthesia Transfer of Care Note  Patient: Megan Hebert  Procedure(s) Performed: Procedure(s): COLONOSCOPY WITH PROPOFOL (N/A)  Patient Location: PACU  Anesthesia Type:MAC  Level of Consciousness: awake, alert , oriented and patient cooperative  Airway & Oxygen Therapy: Patient Spontanous Breathing and Patient connected to face mask oxygen  Post-op Assessment: Report given to RN, Post -op Vital signs reviewed and stable and Patient moving all extremities X 4  Post vital signs: stable  Last Vitals:  Vitals:   01/15/17 0746  BP: (!) 145/76  Pulse: 81  Resp: 13  Temp: 36.6 C    Last Pain:  Vitals:   01/15/17 0746  TempSrc: Oral         Complications: No apparent anesthesia complications

## 2017-01-16 ENCOUNTER — Encounter (HOSPITAL_COMMUNITY): Payer: Self-pay | Admitting: Gastroenterology

## 2017-07-19 ENCOUNTER — Other Ambulatory Visit: Payer: Self-pay | Admitting: Internal Medicine

## 2017-07-19 DIAGNOSIS — Z1231 Encounter for screening mammogram for malignant neoplasm of breast: Secondary | ICD-10-CM

## 2017-08-22 ENCOUNTER — Ambulatory Visit
Admission: RE | Admit: 2017-08-22 | Discharge: 2017-08-22 | Disposition: A | Discharge status: Home / Self Care | Payer: Medicare Other | Source: Ambulatory Visit | Attending: Internal Medicine | Admitting: Internal Medicine

## 2017-08-22 DIAGNOSIS — Z1231 Encounter for screening mammogram for malignant neoplasm of breast: Secondary | ICD-10-CM

## 2018-07-17 ENCOUNTER — Other Ambulatory Visit: Payer: Self-pay | Admitting: Internal Medicine

## 2018-07-17 DIAGNOSIS — Z1231 Encounter for screening mammogram for malignant neoplasm of breast: Secondary | ICD-10-CM

## 2018-08-23 ENCOUNTER — Ambulatory Visit
Admission: RE | Admit: 2018-08-23 | Discharge: 2018-08-23 | Disposition: A | Discharge status: Home / Self Care | Payer: Medicare Other | Source: Ambulatory Visit | Attending: Internal Medicine | Admitting: Internal Medicine

## 2018-08-23 DIAGNOSIS — Z1231 Encounter for screening mammogram for malignant neoplasm of breast: Secondary | ICD-10-CM

## 2019-07-16 ENCOUNTER — Other Ambulatory Visit: Payer: Self-pay | Admitting: Internal Medicine

## 2019-07-16 DIAGNOSIS — Z1231 Encounter for screening mammogram for malignant neoplasm of breast: Secondary | ICD-10-CM

## 2019-09-01 ENCOUNTER — Other Ambulatory Visit: Payer: Self-pay

## 2019-09-01 ENCOUNTER — Ambulatory Visit
Admission: RE | Admit: 2019-09-01 | Discharge: 2019-09-01 | Disposition: A | Discharge status: Home / Self Care | Payer: Medicare Other | Source: Ambulatory Visit | Attending: Internal Medicine | Admitting: Internal Medicine

## 2019-09-01 DIAGNOSIS — Z1231 Encounter for screening mammogram for malignant neoplasm of breast: Secondary | ICD-10-CM

## 2020-05-09 DIAGNOSIS — L259 Unspecified contact dermatitis, unspecified cause: Secondary | ICD-10-CM | POA: Diagnosis not present

## 2020-07-23 ENCOUNTER — Other Ambulatory Visit: Payer: Self-pay | Admitting: Internal Medicine

## 2020-07-23 DIAGNOSIS — Z1231 Encounter for screening mammogram for malignant neoplasm of breast: Secondary | ICD-10-CM

## 2020-09-01 ENCOUNTER — Ambulatory Visit
Admission: RE | Admit: 2020-09-01 | Discharge: 2020-09-01 | Disposition: A | Discharge status: Home / Self Care | Payer: Medicare Other | Source: Ambulatory Visit | Attending: Internal Medicine | Admitting: Internal Medicine

## 2020-09-01 DIAGNOSIS — Z1231 Encounter for screening mammogram for malignant neoplasm of breast: Secondary | ICD-10-CM

## 2020-10-01 DIAGNOSIS — Z23 Encounter for immunization: Secondary | ICD-10-CM | POA: Diagnosis not present

## 2020-10-20 DIAGNOSIS — R0981 Nasal congestion: Secondary | ICD-10-CM | POA: Diagnosis not present

## 2020-10-20 DIAGNOSIS — U071 COVID-19: Secondary | ICD-10-CM | POA: Diagnosis not present

## 2020-10-20 DIAGNOSIS — R059 Cough, unspecified: Secondary | ICD-10-CM | POA: Diagnosis not present

## 2020-10-21 ENCOUNTER — Other Ambulatory Visit (HOSPITAL_COMMUNITY): Payer: Self-pay

## 2020-10-21 ENCOUNTER — Telehealth: Payer: Self-pay | Admitting: Physician Assistant

## 2020-10-21 NOTE — Telephone Encounter (Signed)
Called to Discuss with patient about Covid symptoms and the use of the monoclonal antibody infusion for those with mild to moderate Covid symptoms and at a high risk of hospitalization.     Pt appears to qualify for this infusion due to co-morbid conditions and/or a member of an at-risk group in accordance with the FDA Emergency Use Authorization.    Unable to reach pt. Voice mail is full.   

## 2020-10-22 ENCOUNTER — Telehealth: Payer: Self-pay | Admitting: Physician Assistant

## 2020-10-22 NOTE — Telephone Encounter (Signed)
Called to discuss with Quin Hoop about Covid symptoms and the use of sotrovimab, casirivimab/imdevimab or bamlanivimab/etesevimab infusion for those with mild to moderate Covid symptoms and at a high risk of hospitalization.     Pt had mab brought to her home and treated today.    Patient Active Problem List   Diagnosis Date Noted  . Osteoarthritis of left knee 05/10/2012    Cline Crock PA-C

## 2020-12-16 DIAGNOSIS — Z0000 Encounter for general adult medical examination without abnormal findings: Secondary | ICD-10-CM | POA: Diagnosis not present

## 2020-12-16 DIAGNOSIS — R7309 Other abnormal glucose: Secondary | ICD-10-CM | POA: Diagnosis not present

## 2020-12-16 DIAGNOSIS — Z1389 Encounter for screening for other disorder: Secondary | ICD-10-CM | POA: Diagnosis not present

## 2020-12-16 DIAGNOSIS — E7800 Pure hypercholesterolemia, unspecified: Secondary | ICD-10-CM | POA: Diagnosis not present

## 2020-12-16 DIAGNOSIS — M810 Age-related osteoporosis without current pathological fracture: Secondary | ICD-10-CM | POA: Diagnosis not present

## 2020-12-17 ENCOUNTER — Other Ambulatory Visit: Payer: Self-pay | Admitting: Internal Medicine

## 2020-12-17 DIAGNOSIS — M810 Age-related osteoporosis without current pathological fracture: Secondary | ICD-10-CM

## 2021-05-02 DIAGNOSIS — Z01411 Encounter for gynecological examination (general) (routine) with abnormal findings: Secondary | ICD-10-CM | POA: Diagnosis not present

## 2021-05-02 DIAGNOSIS — Z124 Encounter for screening for malignant neoplasm of cervix: Secondary | ICD-10-CM | POA: Diagnosis not present

## 2021-05-02 DIAGNOSIS — Z6824 Body mass index (BMI) 24.0-24.9, adult: Secondary | ICD-10-CM | POA: Diagnosis not present

## 2021-05-02 DIAGNOSIS — M8580 Other specified disorders of bone density and structure, unspecified site: Secondary | ICD-10-CM | POA: Diagnosis not present

## 2021-05-02 DIAGNOSIS — Z01419 Encounter for gynecological examination (general) (routine) without abnormal findings: Secondary | ICD-10-CM | POA: Diagnosis not present

## 2021-05-17 ENCOUNTER — Other Ambulatory Visit: Payer: Medicare PPO

## 2021-05-31 DIAGNOSIS — H52223 Regular astigmatism, bilateral: Secondary | ICD-10-CM | POA: Diagnosis not present

## 2021-05-31 DIAGNOSIS — H524 Presbyopia: Secondary | ICD-10-CM | POA: Diagnosis not present

## 2021-05-31 DIAGNOSIS — Z9849 Cataract extraction status, unspecified eye: Secondary | ICD-10-CM | POA: Diagnosis not present

## 2021-05-31 DIAGNOSIS — Z961 Presence of intraocular lens: Secondary | ICD-10-CM | POA: Diagnosis not present

## 2021-05-31 DIAGNOSIS — H5211 Myopia, right eye: Secondary | ICD-10-CM | POA: Diagnosis not present

## 2021-05-31 DIAGNOSIS — H04123 Dry eye syndrome of bilateral lacrimal glands: Secondary | ICD-10-CM | POA: Diagnosis not present

## 2021-07-07 ENCOUNTER — Other Ambulatory Visit: Payer: Self-pay

## 2021-07-07 ENCOUNTER — Ambulatory Visit
Admission: RE | Admit: 2021-07-07 | Discharge: 2021-07-07 | Disposition: A | Discharge status: Home / Self Care | Payer: Medicare PPO | Source: Ambulatory Visit | Attending: Internal Medicine | Admitting: Internal Medicine

## 2021-07-07 DIAGNOSIS — M810 Age-related osteoporosis without current pathological fracture: Secondary | ICD-10-CM

## 2021-07-07 DIAGNOSIS — Z780 Asymptomatic menopausal state: Secondary | ICD-10-CM | POA: Diagnosis not present

## 2021-07-07 DIAGNOSIS — M8589 Other specified disorders of bone density and structure, multiple sites: Secondary | ICD-10-CM | POA: Diagnosis not present

## 2021-08-01 ENCOUNTER — Other Ambulatory Visit: Payer: Self-pay | Admitting: Internal Medicine

## 2021-08-01 DIAGNOSIS — Z1231 Encounter for screening mammogram for malignant neoplasm of breast: Secondary | ICD-10-CM

## 2021-09-02 ENCOUNTER — Other Ambulatory Visit: Payer: Self-pay

## 2021-09-02 ENCOUNTER — Ambulatory Visit
Admission: RE | Admit: 2021-09-02 | Discharge: 2021-09-02 | Disposition: A | Discharge status: Home / Self Care | Payer: Medicare PPO | Source: Ambulatory Visit | Attending: Internal Medicine | Admitting: Internal Medicine

## 2021-09-02 DIAGNOSIS — Z1231 Encounter for screening mammogram for malignant neoplasm of breast: Secondary | ICD-10-CM | POA: Diagnosis not present

## 2021-09-05 ENCOUNTER — Ambulatory Visit: Payer: Medicare PPO

## 2021-12-27 DIAGNOSIS — R7303 Prediabetes: Secondary | ICD-10-CM | POA: Diagnosis not present

## 2021-12-27 DIAGNOSIS — Z0000 Encounter for general adult medical examination without abnormal findings: Secondary | ICD-10-CM | POA: Diagnosis not present

## 2021-12-27 DIAGNOSIS — Z1389 Encounter for screening for other disorder: Secondary | ICD-10-CM | POA: Diagnosis not present

## 2021-12-27 DIAGNOSIS — R030 Elevated blood-pressure reading, without diagnosis of hypertension: Secondary | ICD-10-CM | POA: Diagnosis not present

## 2021-12-27 DIAGNOSIS — E7800 Pure hypercholesterolemia, unspecified: Secondary | ICD-10-CM | POA: Diagnosis not present

## 2021-12-27 DIAGNOSIS — M810 Age-related osteoporosis without current pathological fracture: Secondary | ICD-10-CM | POA: Diagnosis not present

## 2022-01-02 DIAGNOSIS — R350 Frequency of micturition: Secondary | ICD-10-CM | POA: Diagnosis not present

## 2022-01-02 DIAGNOSIS — R030 Elevated blood-pressure reading, without diagnosis of hypertension: Secondary | ICD-10-CM | POA: Diagnosis not present

## 2022-04-05 IMAGING — MG MM DIGITAL SCREENING BILAT W/ TOMO AND CAD
8 series · 9 of 24 positions shown · non-contrast
Comparison: Previous exam(s).

CLINICAL DATA: Screening.

EXAM:
DIGITAL SCREENING BILATERAL MAMMOGRAM WITH TOMOSYNTHESIS AND CAD
TECHNIQUE: Bilateral screening digital craniocaudal and mediolateral oblique
mammograms were obtained. Bilateral screening digital breast
tomosynthesis was performed. The images were evaluated with
computer-aided detection.

[L CC synth-2D]
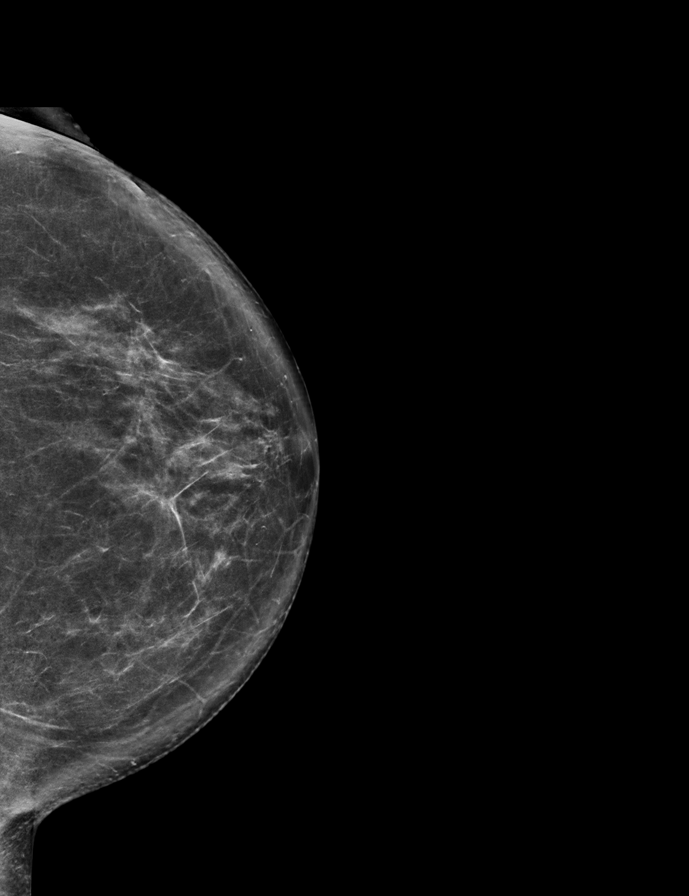

[R CC synth-2D]
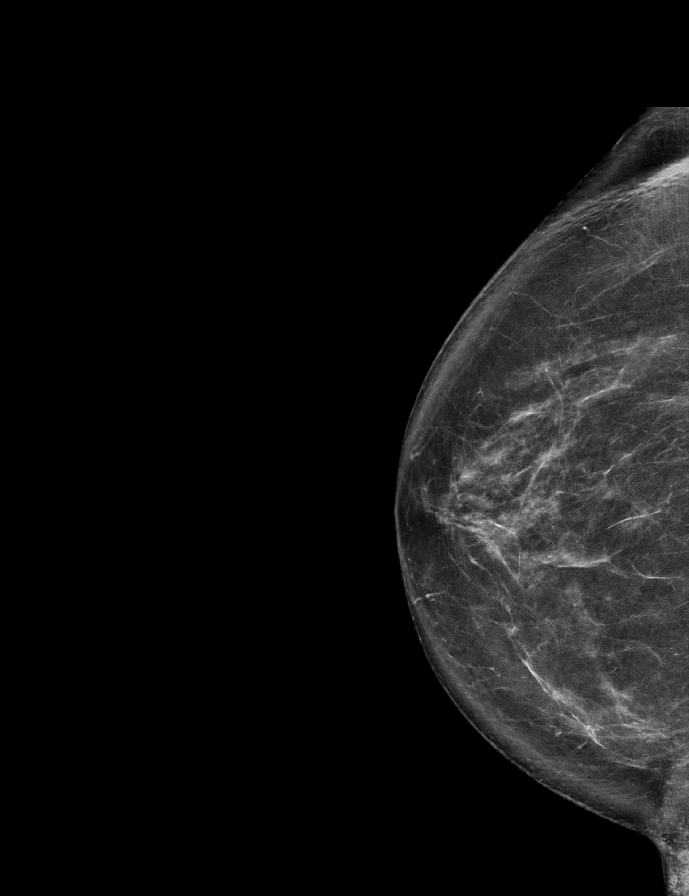

[R MLO synth-2D]
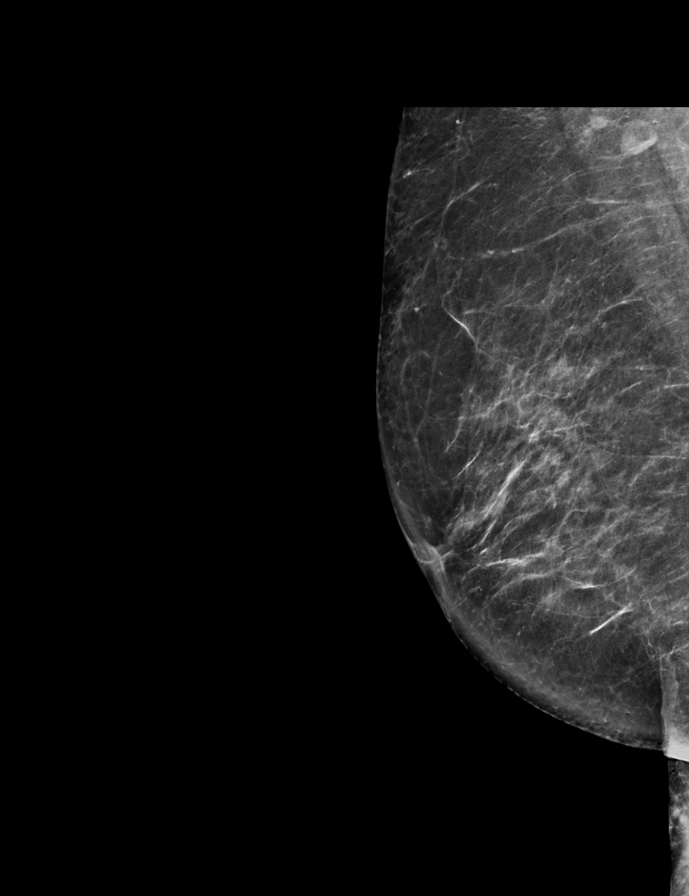

[L MLO synth-2D]
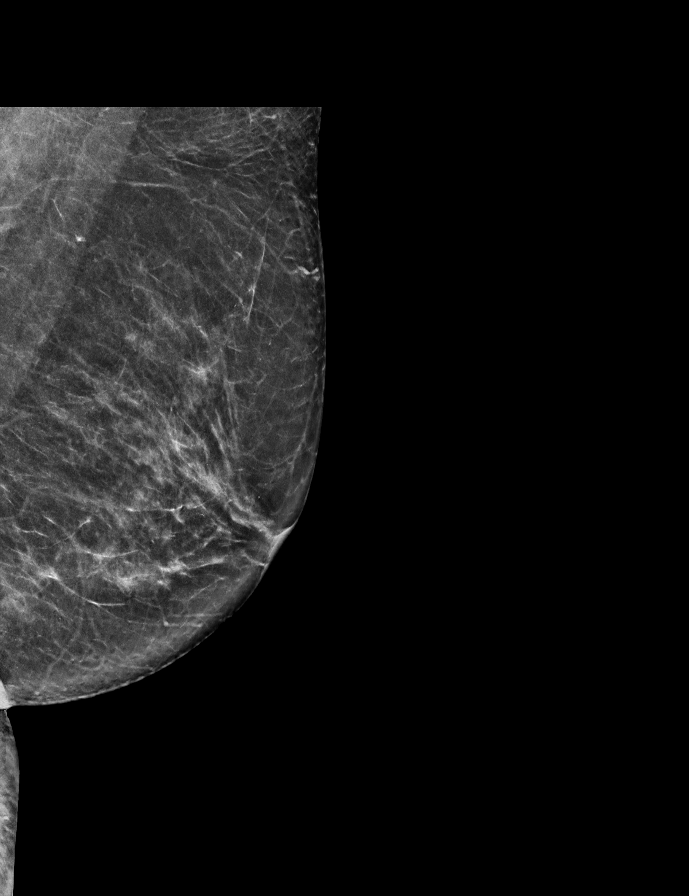

[L CC tomo · 2 of 77 frames shown]
[frame 25/77]
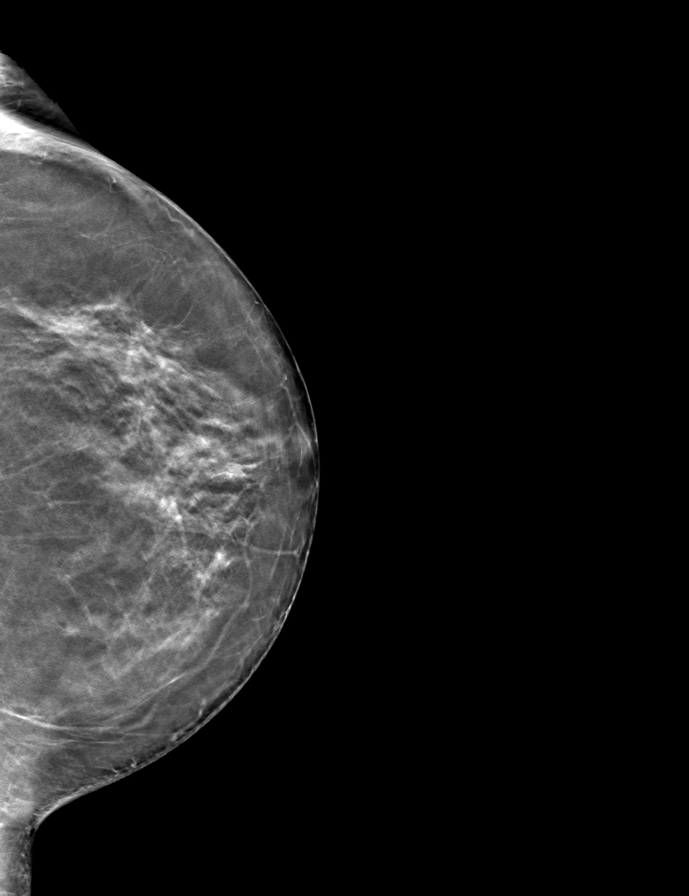
[frame 39/77]
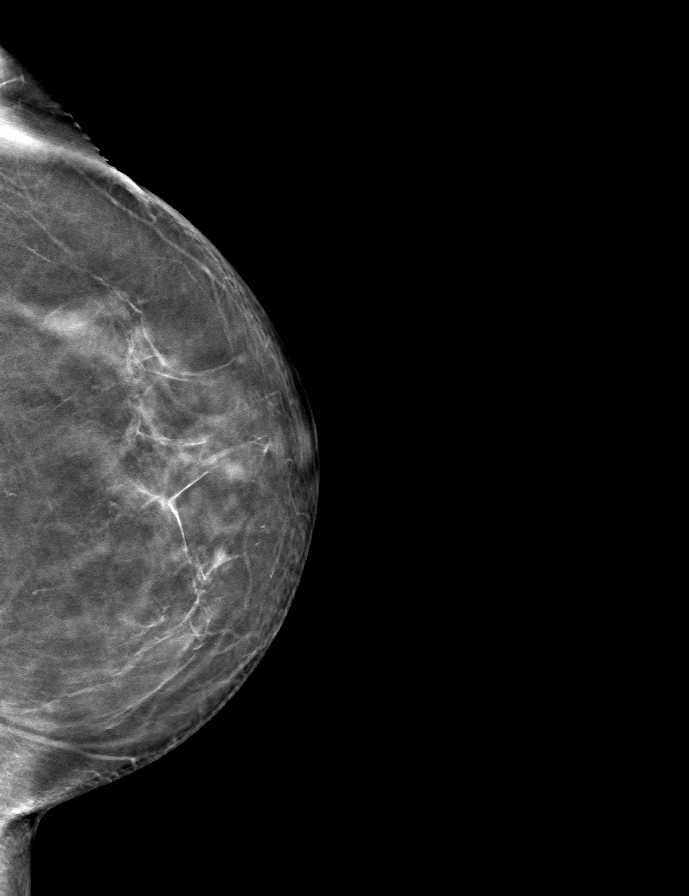

[R CC tomo · tomo slice 39/78.0]
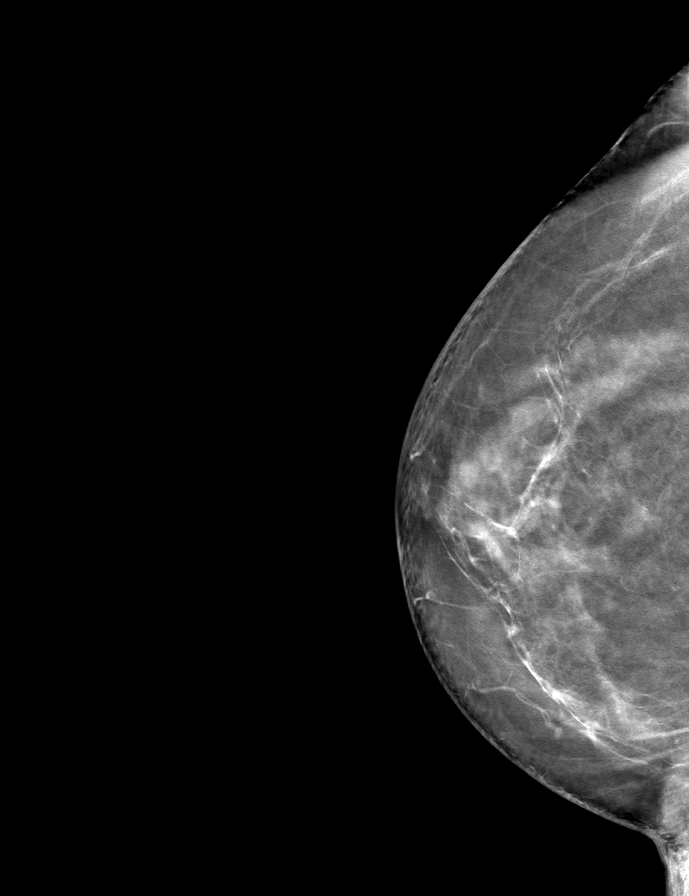

[L MLO tomo · tomo slice 35/70.0]
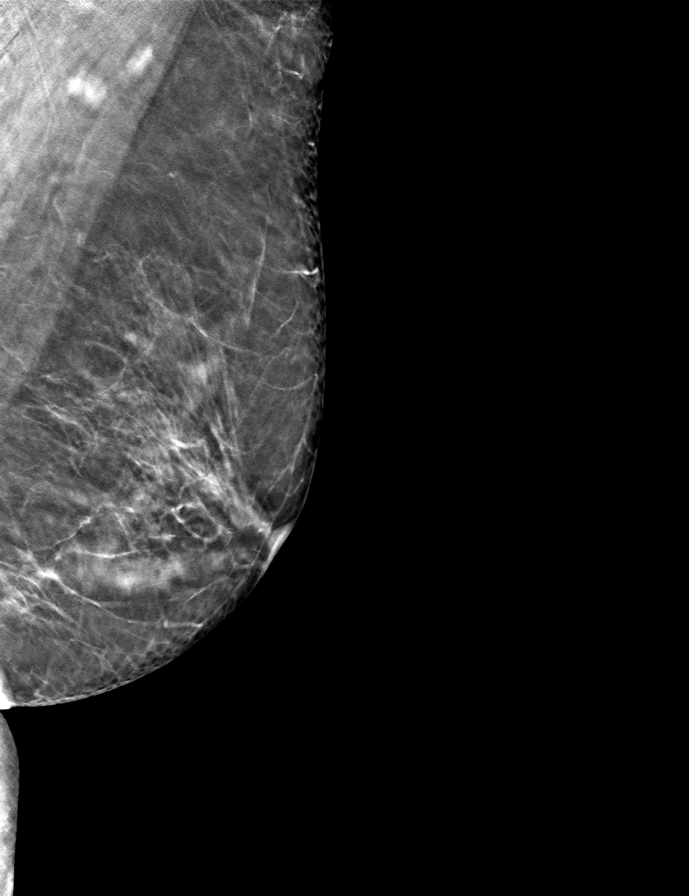

[R MLO tomo · tomo slice 37/73.0]
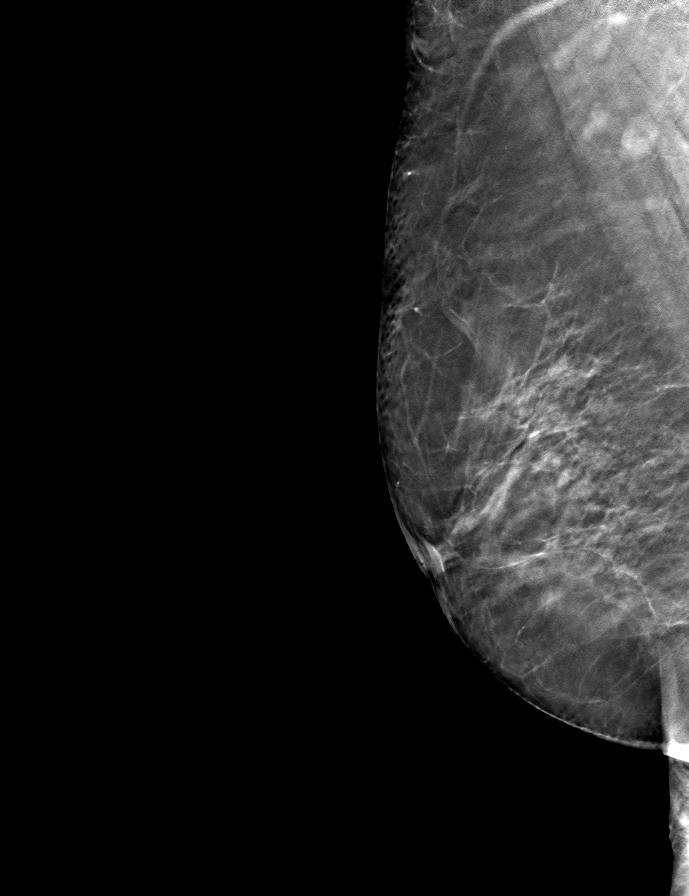

[9 of 24 positions shown; findings below may reference images not displayed]

ACR Breast Density Category b: There are scattered areas of
fibroglandular density.
FINDINGS: There are no findings suspicious for malignancy.
IMPRESSION: No mammographic evidence of malignancy. A result letter of this
screening mammogram will be mailed directly to the patient.

RECOMMENDATION:
Screening mammogram in one year. (Code:51-O-LD2)

BI-RADS CATEGORY  1: Negative.

## 2022-05-31 DIAGNOSIS — Z961 Presence of intraocular lens: Secondary | ICD-10-CM | POA: Diagnosis not present

## 2022-05-31 DIAGNOSIS — H04123 Dry eye syndrome of bilateral lacrimal glands: Secondary | ICD-10-CM | POA: Diagnosis not present

## 2022-05-31 DIAGNOSIS — H43813 Vitreous degeneration, bilateral: Secondary | ICD-10-CM | POA: Diagnosis not present

## 2022-05-31 DIAGNOSIS — Z9849 Cataract extraction status, unspecified eye: Secondary | ICD-10-CM | POA: Diagnosis not present

## 2022-05-31 DIAGNOSIS — H43393 Other vitreous opacities, bilateral: Secondary | ICD-10-CM | POA: Diagnosis not present

## 2022-06-27 DIAGNOSIS — R030 Elevated blood-pressure reading, without diagnosis of hypertension: Secondary | ICD-10-CM | POA: Diagnosis not present

## 2022-06-27 DIAGNOSIS — R7303 Prediabetes: Secondary | ICD-10-CM | POA: Diagnosis not present

## 2022-07-31 ENCOUNTER — Other Ambulatory Visit: Payer: Self-pay | Admitting: Internal Medicine

## 2022-07-31 DIAGNOSIS — Z1231 Encounter for screening mammogram for malignant neoplasm of breast: Secondary | ICD-10-CM

## 2022-09-04 ENCOUNTER — Ambulatory Visit
Admission: RE | Admit: 2022-09-04 | Discharge: 2022-09-04 | Disposition: A | Discharge status: Home / Self Care | Payer: Medicare PPO | Source: Ambulatory Visit | Attending: Internal Medicine | Admitting: Internal Medicine

## 2022-09-04 DIAGNOSIS — Z1231 Encounter for screening mammogram for malignant neoplasm of breast: Secondary | ICD-10-CM

## 2023-01-03 DIAGNOSIS — R7303 Prediabetes: Secondary | ICD-10-CM | POA: Diagnosis not present

## 2023-01-03 DIAGNOSIS — M810 Age-related osteoporosis without current pathological fracture: Secondary | ICD-10-CM | POA: Diagnosis not present

## 2023-01-03 DIAGNOSIS — Z0000 Encounter for general adult medical examination without abnormal findings: Secondary | ICD-10-CM | POA: Diagnosis not present

## 2023-01-03 DIAGNOSIS — E7800 Pure hypercholesterolemia, unspecified: Secondary | ICD-10-CM | POA: Diagnosis not present

## 2023-01-03 DIAGNOSIS — R030 Elevated blood-pressure reading, without diagnosis of hypertension: Secondary | ICD-10-CM | POA: Diagnosis not present

## 2023-01-03 DIAGNOSIS — Z1331 Encounter for screening for depression: Secondary | ICD-10-CM | POA: Diagnosis not present

## 2023-07-17 DIAGNOSIS — R030 Elevated blood-pressure reading, without diagnosis of hypertension: Secondary | ICD-10-CM | POA: Diagnosis not present

## 2023-07-17 DIAGNOSIS — E7800 Pure hypercholesterolemia, unspecified: Secondary | ICD-10-CM | POA: Diagnosis not present

## 2023-07-17 DIAGNOSIS — R7303 Prediabetes: Secondary | ICD-10-CM | POA: Diagnosis not present

## 2023-07-17 DIAGNOSIS — M810 Age-related osteoporosis without current pathological fracture: Secondary | ICD-10-CM | POA: Diagnosis not present

## 2023-08-07 ENCOUNTER — Other Ambulatory Visit: Payer: Self-pay | Admitting: Internal Medicine

## 2023-08-07 DIAGNOSIS — Z1231 Encounter for screening mammogram for malignant neoplasm of breast: Secondary | ICD-10-CM

## 2023-09-06 ENCOUNTER — Ambulatory Visit
Admission: RE | Admit: 2023-09-06 | Discharge: 2023-09-06 | Disposition: A | Discharge status: Home / Self Care | Payer: Medicare PPO | Source: Ambulatory Visit | Attending: Internal Medicine | Admitting: Internal Medicine

## 2023-09-06 DIAGNOSIS — Z1231 Encounter for screening mammogram for malignant neoplasm of breast: Secondary | ICD-10-CM

## 2024-01-11 ENCOUNTER — Other Ambulatory Visit: Payer: Self-pay | Admitting: Internal Medicine

## 2024-01-11 DIAGNOSIS — Z23 Encounter for immunization: Secondary | ICD-10-CM | POA: Diagnosis not present

## 2024-01-11 DIAGNOSIS — Z0000 Encounter for general adult medical examination without abnormal findings: Secondary | ICD-10-CM | POA: Diagnosis not present

## 2024-01-11 DIAGNOSIS — E7800 Pure hypercholesterolemia, unspecified: Secondary | ICD-10-CM | POA: Diagnosis not present

## 2024-01-11 DIAGNOSIS — R7303 Prediabetes: Secondary | ICD-10-CM | POA: Diagnosis not present

## 2024-01-11 DIAGNOSIS — Z1389 Encounter for screening for other disorder: Secondary | ICD-10-CM | POA: Diagnosis not present

## 2024-01-11 DIAGNOSIS — M810 Age-related osteoporosis without current pathological fracture: Secondary | ICD-10-CM | POA: Diagnosis not present

## 2024-01-11 DIAGNOSIS — M25511 Pain in right shoulder: Secondary | ICD-10-CM | POA: Diagnosis not present

## 2024-05-31 ENCOUNTER — Encounter (HOSPITAL_BASED_OUTPATIENT_CLINIC_OR_DEPARTMENT_OTHER): Payer: Self-pay

## 2024-05-31 ENCOUNTER — Emergency Department (HOSPITAL_BASED_OUTPATIENT_CLINIC_OR_DEPARTMENT_OTHER)

## 2024-05-31 ENCOUNTER — Emergency Department (HOSPITAL_BASED_OUTPATIENT_CLINIC_OR_DEPARTMENT_OTHER)
Admission: EM | Admit: 2024-05-31 | Discharge: 2024-05-31 | Disposition: A | Discharge status: Home / Self Care | Attending: Emergency Medicine | Admitting: Emergency Medicine

## 2024-05-31 ENCOUNTER — Other Ambulatory Visit: Payer: Self-pay

## 2024-05-31 DIAGNOSIS — S22088A Other fracture of T11-T12 vertebra, initial encounter for closed fracture: Secondary | ICD-10-CM | POA: Diagnosis not present

## 2024-05-31 DIAGNOSIS — M549 Dorsalgia, unspecified: Secondary | ICD-10-CM | POA: Diagnosis present

## 2024-05-31 DIAGNOSIS — Y93K1 Activity, walking an animal: Secondary | ICD-10-CM | POA: Insufficient documentation

## 2024-05-31 DIAGNOSIS — W19XXXA Unspecified fall, initial encounter: Secondary | ICD-10-CM | POA: Diagnosis not present

## 2024-05-31 DIAGNOSIS — M545 Low back pain, unspecified: Secondary | ICD-10-CM | POA: Diagnosis not present

## 2024-05-31 DIAGNOSIS — S22080A Wedge compression fracture of T11-T12 vertebra, initial encounter for closed fracture: Secondary | ICD-10-CM

## 2024-05-31 DIAGNOSIS — S22089A Unspecified fracture of T11-T12 vertebra, initial encounter for closed fracture: Secondary | ICD-10-CM | POA: Diagnosis not present

## 2024-05-31 DIAGNOSIS — M47816 Spondylosis without myelopathy or radiculopathy, lumbar region: Secondary | ICD-10-CM | POA: Diagnosis not present

## 2024-05-31 DIAGNOSIS — S3993XA Unspecified injury of pelvis, initial encounter: Secondary | ICD-10-CM | POA: Diagnosis not present

## 2024-05-31 DIAGNOSIS — I10 Essential (primary) hypertension: Secondary | ICD-10-CM | POA: Insufficient documentation

## 2024-05-31 DIAGNOSIS — Z043 Encounter for examination and observation following other accident: Secondary | ICD-10-CM | POA: Diagnosis not present

## 2024-05-31 DIAGNOSIS — M47814 Spondylosis without myelopathy or radiculopathy, thoracic region: Secondary | ICD-10-CM | POA: Diagnosis not present

## 2024-05-31 DIAGNOSIS — M4854XA Collapsed vertebra, not elsewhere classified, thoracic region, initial encounter for fracture: Secondary | ICD-10-CM | POA: Diagnosis not present

## 2024-05-31 MED ORDER — OXYCODONE-ACETAMINOPHEN 5-325 MG PO TABS: 1.0000 | ORAL_TABLET | Freq: Four times a day (QID) | ORAL | 0 refills | Status: AC | PRN

## 2024-05-31 MED ORDER — IPRATROPIUM-ALBUTEROL 0.5-2.5 (3) MG/3ML IN SOLN: 3.0000 mL | Freq: Once | RESPIRATORY_TRACT | Status: DC

## 2024-05-31 MED ORDER — IPRATROPIUM-ALBUTEROL 0.5-2.5 (3) MG/3ML IN SOLN
RESPIRATORY_TRACT | Status: AC
  Filled 2024-05-31: qty 9

## 2024-05-31 NOTE — ED Provider Notes (Signed)
 [place] EMERGENCY DEPARTMENT AT [facility] Provider Note   CSN: [id number] Arrival date & time: 05/31/24  1156     Patient presents with: Back Pain   Megan Hebert  is a 78 y.o. female.   78 year old female presenting after fall.  Patient was walking her dog Thursday evening when she plopped down in the grass on her buttocks after being pulled by her dog's leash.  She has complained of back/left-sided flank pain since this fall, her pain was relatively well-controlled with Tylenol /ibuprofen however last night she admits she did not get much sleep secondary to pain.  No radicular symptoms/weakness/sensory deficits of bilateral lower extremities, denies bowel/bladder incontinence.   Back Pain      Prior to Admission medications   Medication Sig Start Date End Date Taking? Authorizing Provider  Ascorbic Acid (VITAMIN C) 1000 MG tablet Take 1,000 mg by mouth daily.    [provider]  Cholecalciferol (VITAMIN D3) 5000 units TABS Take 1 tablet by mouth daily.     [provider]  Coenzyme Q10 (COQ10) 100 MG CAPS Take 1 tablet by mouth daily.    [provider]  Multiple Vitamin (MULTIVITAMIN WITH MINERALS) TABS Take 1 tablet by mouth daily.    [provider]  Omega-3 Fatty Acids (FISH OIL) 1000 MG CAPS Take 1 capsule by mouth daily.    [provider]    Allergies: Cefoxitin and Flagyl [metronidazole]    Review of Systems  Musculoskeletal:  Positive for back pain.    Updated Vital Signs  Vitals:   05/31/24 1204 05/31/24 1400 05/31/24 1415 05/31/24 1500  BP: (!) 177/79 (!) 173/81 (!) 175/86 (!) 171/89  Pulse: 86 79 76 76  Resp: 16  18 18   Temp: 98.3 F (36.8 C)     TempSrc: Oral     SpO2: 97% 98% 97% 96%     Physical Exam Vitals and nursing note reviewed.  HENT:     Head: Normocephalic.  Eyes:     Extraocular Movements: Extraocular movements intact.  Cardiovascular:     Rate and Rhythm: Normal rate.   Pulmonary:     Effort: Pulmonary effort is normal.  Musculoskeletal:     Cervical back: Normal range of motion.     Comments: 5 out of 5 strength against resistance of bilateral lower extremities Back: Left sided paralumbar muscle tenderness to palpation.  No midline tenderness or palpable step-offs/deformities of spine. Pelvis: No bony tenderness to palpation  Skin:    General: Skin is warm and dry.  Neurological:     Mental Status: She is alert and oriented to person, place, and time.     Sensory: No sensory deficit.     Motor: No weakness.     Comments: Ambulates on her own without difficulty No appreciable sensory deficit/weakness of bilateral lower extremities     (all labs ordered are listed, but only abnormal results are displayed) Labs Reviewed - No data to display  EKG: None  Radiology: DG Pelvis 1-2 Views Result Date: 05/31/2024 CLINICAL DATA:  Slipped and fell onto her buttocks wall walking her dogs 2 days ago. Persistent left-sided low back pain. EXAM: PELVIS - 1-2 VIEW COMPARISON:  None Available. FINDINGS: No fracture or acute finding.  No bone lesion. Hip joints, SI joints and pubic symphysis are normally spaced and aligned. Skeletal structures are demineralized. Increased stool noted in the visualized colon and rectum. IMPRESSION: 1. No fracture or acute finding. Electronically Signed   By: Alm  Ormond M.D.   On: 05/31/2024 15:56   DG Lumbar Spine 2-3 Views Result Date: 05/31/2024 CLINICAL DATA:  Septum fell on 2 buttocks while walking her dog 2 days ago. Persistent left-sided low back pain. EXAM: LUMBAR SPINE - 2-3 VIEW COMPARISON:  None Available. FINDINGS: T11 fracture as detailed under the thoracic spine radiographs, unclear chronicity. No lumbar spine fractures. Moderate levoscoliosis, apex at L3-L4. No spondylolisthesis. Skeletal structures are demineralized.  No bone lesion. Mild loss of disc height at L2-L3 and L3-L4 with moderate loss of disc height at L4-L5  and L5-S1. Soft tissues are unremarkable. IMPRESSION: 1. Fracture of T11 of unclear chronicity as detailed under the thoracic spine radiographs. 2. No lumbar spine fracture or acute finding. 3. Skeletal demineralization, levoscoliosis and disc degenerative changes as detailed. Electronically Signed   By: Alm Parkins M.D.   On: 05/31/2024 15:55   DG Thoracic Spine 2 View Result Date: 05/31/2024 CLINICAL DATA:  Slipped and fell on to the buttocks while walking her dog. Left-sided low back pain. EXAM: THORACIC SPINE 2 VIEWS COMPARISON:  Chest radiograph from 05/03/2012. FINDINGS: Moderate compression fracture of T11, not present on the prior lateral chest radiograph, but of unclear chronicity. No other fractures. No bone lesions. Mild curvature, convex to the right of the lower thoracic spine. No spondylolisthesis. Skeletal structures are diffusely demineralized. There are disc degenerative changes with mild loss of disc height and endplate osteophytes predominantly along the midthoracic spine. Soft tissues are unremarkable. IMPRESSION: 1. Moderate compression fracture of T11 of unclear chronicity. 2. No other fractures. 3. Diffuse skeletal demineralization and disc degenerative changes. Electronically Signed   By: Alm Parkins M.D.   On: 05/31/2024 15:53     Procedures   Medications Ordered in the ED - No data to display                                  Medical Decision Making This patient presents to the ED for concern of fall, this involves an extensive number of treatment options, and is a complaint that carries with it a high risk of complications and morbidity.  The differential diagnosis includes fracture, dislocation, musculoskeletal sprain/strain/pain, spinal cord compression.   Co morbidities that complicate the patient evaluation  Hypertension   Additional history obtained:  Additional history obtained from record review External records from outside source obtained and reviewed  including prior PCP notes   Imaging Studies ordered:  I ordered imaging studies including XR pelvis/T-spine/L-spine  I independently visualized and interpreted imaging which showed  - pelvis: No fracture or acute finding. - T spine/L-spine: 1. Moderate compression fracture of T11 of unclear chronicity. 2. No other fractures. 3. Diffuse skeletal demineralization and disc degenerative changes.  I agree with the radiologist interpretation   Cardiac Monitoring: / EKG:  The patient was maintained on a cardiac monitor.  I personally viewed and interpreted the cardiac monitored which showed an underlying rhythm of: NSR   Problem List / ED Course / Critical interventions / Medication management  I have reviewed the patients home medicines and have made adjustments as needed   Test / Admission - Considered:  Physical exam notable as above.  See above for imaging interpretation.  Patient is not demonstrating any weakness or sensory deficits of the lower extremities, she has not experienced any bowel/bladder incontinence nor does she note any GU paresthesias, I have a low suspicion for cauda equina secondary to compression  fracture at this time and do not feel that additional imaging is warranted.  Will prescribe patient with short course of Percocet to be used as needed for breakthrough pain.  Will provide the patient with the contact information for an orthopedic specialist to schedule follow-up given her T11 compression fracture.  Patient and her family voiced understanding and are in agreement with this plan.  Strict return precautions discussed, she is appropriate discharge at this time.  Staffed with Dr. Bernard    Amount and/or Complexity of Data Reviewed Radiology: ordered.  Risk Prescription drug management.       Final diagnoses:  Compression fracture of T11 vertebra, initial encounter [facility])    ED Discharge Orders          Ordered    oxyCODONE -acetaminophen   (PERCOCET/ROXICET) 5-325 MG tablet  Every 6 hours PRN        05/31/24 1619               Glendia Rocky SAILOR, PA-C 05/31/24 1624    Bernard Drivers, MD 06/01/24 [phone]

## 2024-05-31 NOTE — ED Triage Notes (Signed)
 She states she slipped and fell onto my buttocks while walking her dog two days ago. She c/o persistent, non-radiating left-sided low back pain. She is ambulatory and in no distress.

## 2024-05-31 NOTE — Discharge Instructions (Signed)
 I have provided you with the contact information for an orthopedic specialist, please call their office to schedule follow-up in regard to your fractured vertebrae.  Return to the emergency department if your symptoms worsen or if you develop weakness/sensory changes of the lower extremities or bowel/bladder incontinence.  Continue Tylenol /ibuprofen as needed for pain, you may take Percocet every 6 hours as needed for breakthrough pain, this medication may result in fatigue/drowsiness.  Follow-up with your primary care provider.

## 2024-06-17 DIAGNOSIS — S22080A Wedge compression fracture of T11-T12 vertebra, initial encounter for closed fracture: Secondary | ICD-10-CM | POA: Diagnosis not present

## 2024-07-10 DIAGNOSIS — S22080A Wedge compression fracture of T11-T12 vertebra, initial encounter for closed fracture: Secondary | ICD-10-CM | POA: Diagnosis not present

## 2024-08-15 ENCOUNTER — Other Ambulatory Visit: Payer: Self-pay | Admitting: Internal Medicine

## 2024-08-15 DIAGNOSIS — Z1231 Encounter for screening mammogram for malignant neoplasm of breast: Secondary | ICD-10-CM

## 2024-09-05 ENCOUNTER — Other Ambulatory Visit: Payer: Medicare PPO

## 2024-09-09 DIAGNOSIS — S22080A Wedge compression fracture of T11-T12 vertebra, initial encounter for closed fracture: Secondary | ICD-10-CM | POA: Diagnosis not present

## 2024-09-12 ENCOUNTER — Ambulatory Visit
Admission: RE | Admit: 2024-09-12 | Discharge: 2024-09-12 | Disposition: A | Discharge status: Home / Self Care | Source: Ambulatory Visit | Attending: Internal Medicine | Admitting: Internal Medicine

## 2024-09-12 DIAGNOSIS — Z1231 Encounter for screening mammogram for malignant neoplasm of breast: Secondary | ICD-10-CM | POA: Diagnosis not present

## 2024-10-08 ENCOUNTER — Ambulatory Visit (HOSPITAL_BASED_OUTPATIENT_CLINIC_OR_DEPARTMENT_OTHER)
Admission: RE | Admit: 2024-10-08 | Discharge: 2024-10-08 | Disposition: A | Discharge status: Home / Self Care | Source: Ambulatory Visit | Attending: Internal Medicine | Admitting: Internal Medicine

## 2024-10-08 DIAGNOSIS — M810 Age-related osteoporosis without current pathological fracture: Secondary | ICD-10-CM | POA: Diagnosis not present
